# Patient Record
Sex: Male | Born: 1965 | Race: Black or African American | Hispanic: No | Marital: Single | Smoking: Current every day smoker
Health system: Southern US, Community
[De-identification: ages and names within clinical notes are randomized; demographics above are authoritative.]

## PROBLEM LIST (undated history)

## (undated) DIAGNOSIS — G8929 Other chronic pain: Secondary | ICD-10-CM

## (undated) DIAGNOSIS — W3400XA Accidental discharge from unspecified firearms or gun, initial encounter: Secondary | ICD-10-CM

## (undated) DIAGNOSIS — R569 Unspecified convulsions: Secondary | ICD-10-CM

## (undated) DIAGNOSIS — E079 Disorder of thyroid, unspecified: Secondary | ICD-10-CM

## (undated) DIAGNOSIS — D571 Sickle-cell disease without crisis: Secondary | ICD-10-CM

## (undated) DIAGNOSIS — R296 Repeated falls: Secondary | ICD-10-CM

## (undated) DIAGNOSIS — L0390 Cellulitis, unspecified: Secondary | ICD-10-CM

## (undated) DIAGNOSIS — D649 Anemia, unspecified: Secondary | ICD-10-CM

## (undated) DIAGNOSIS — I219 Acute myocardial infarction, unspecified: Secondary | ICD-10-CM

## (undated) HISTORY — PX: OTHER SURGICAL HISTORY: SHX169

## (undated) HISTORY — PX: BACK SURGERY: SHX140

## (undated) HISTORY — PX: GASTRIC BYPASS: SHX52

---

## 2015-06-06 ENCOUNTER — Encounter (HOSPITAL_COMMUNITY): Payer: Self-pay

## 2015-06-06 ENCOUNTER — Emergency Department (HOSPITAL_COMMUNITY)
Admission: EM | Admit: 2015-06-06 | Discharge: 2015-06-06 | Disposition: A | Discharge status: Home / Self Care | Payer: Medicaid Other | Attending: Emergency Medicine | Admitting: Emergency Medicine

## 2015-06-06 DIAGNOSIS — Z8639 Personal history of other endocrine, nutritional and metabolic disease: Secondary | ICD-10-CM | POA: Diagnosis not present

## 2015-06-06 DIAGNOSIS — F17200 Nicotine dependence, unspecified, uncomplicated: Secondary | ICD-10-CM | POA: Diagnosis not present

## 2015-06-06 DIAGNOSIS — Z7982 Long term (current) use of aspirin: Secondary | ICD-10-CM | POA: Diagnosis not present

## 2015-06-06 DIAGNOSIS — Z872 Personal history of diseases of the skin and subcutaneous tissue: Secondary | ICD-10-CM | POA: Insufficient documentation

## 2015-06-06 DIAGNOSIS — Z9181 History of falling: Secondary | ICD-10-CM | POA: Diagnosis not present

## 2015-06-06 DIAGNOSIS — Z4801 Encounter for change or removal of surgical wound dressing: Secondary | ICD-10-CM | POA: Diagnosis not present

## 2015-06-06 DIAGNOSIS — Z79899 Other long term (current) drug therapy: Secondary | ICD-10-CM | POA: Insufficient documentation

## 2015-06-06 DIAGNOSIS — Z862 Personal history of diseases of the blood and blood-forming organs and certain disorders involving the immune mechanism: Secondary | ICD-10-CM | POA: Diagnosis not present

## 2015-06-06 DIAGNOSIS — I252 Old myocardial infarction: Secondary | ICD-10-CM | POA: Insufficient documentation

## 2015-06-06 DIAGNOSIS — Z5189 Encounter for other specified aftercare: Secondary | ICD-10-CM

## 2015-06-06 HISTORY — DX: Acute myocardial infarction, unspecified: I21.9

## 2015-06-06 HISTORY — DX: Disorder of thyroid, unspecified: E07.9

## 2015-06-06 HISTORY — DX: Cellulitis, unspecified: L03.90

## 2015-06-06 HISTORY — DX: Sickle-cell disease without crisis: D57.1

## 2015-06-06 HISTORY — DX: Anemia, unspecified: D64.9

## 2015-06-06 HISTORY — DX: Accidental discharge from unspecified firearms or gun, initial encounter: W34.00XA

## 2015-06-06 HISTORY — DX: Repeated falls: R29.6

## 2015-06-06 LAB — CBC WITH DIFFERENTIAL/PLATELET
BASOS ABS: 0 10*3/uL (ref 0.0–0.1)
BASOS PCT: 1 %
EOS PCT: 6 %
Eosinophils Absolute: 0.2 10*3/uL (ref 0.0–0.7)
HEMATOCRIT: 35 % — AB (ref 39.0–52.0)
HEMOGLOBIN: 10.5 g/dL — AB (ref 13.0–17.0)
LYMPHS ABS: 1.3 10*3/uL (ref 0.7–4.0)
Lymphocytes Relative: 36 %
MCH: 20.9 pg — AB (ref 26.0–34.0)
MCHC: 30 g/dL (ref 30.0–36.0)
MCV: 69.6 fL — AB (ref 78.0–100.0)
MONOS PCT: 9 %
Monocytes Absolute: 0.3 10*3/uL (ref 0.1–1.0)
NEUTROS ABS: 1.9 10*3/uL (ref 1.7–7.7)
Neutrophils Relative %: 48 %
Platelets: 215 10*3/uL (ref 150–400)
RBC: 5.03 MIL/uL (ref 4.22–5.81)
RDW: 24 % — ABNORMAL HIGH (ref 11.5–15.5)
WBC MORPHOLOGY: INCREASED
WBC: 3.7 10*3/uL — ABNORMAL LOW (ref 4.0–10.5)

## 2015-06-06 LAB — BASIC METABOLIC PANEL
Anion gap: 7 (ref 5–15)
BUN: 15 mg/dL (ref 6–20)
CO2: 25 mmol/L (ref 22–32)
Calcium: 8.7 mg/dL — ABNORMAL LOW (ref 8.9–10.3)
Chloride: 105 mmol/L (ref 101–111)
Creatinine, Ser: 0.83 mg/dL (ref 0.61–1.24)
GFR calc Af Amer: >60 mL/min (ref >60)
GLUCOSE: 85 mg/dL (ref 65–99)
POTASSIUM: 4.1 mmol/L (ref 3.5–5.1)
Sodium: 137 mmol/L (ref 135–145)

## 2015-06-06 MED ORDER — BACITRACIN ZINC 500 UNIT/GM EX OINT: 1.0000 "application " | TOPICAL_OINTMENT | Freq: Two times a day (BID) | CUTANEOUS | Status: DC

## 2015-06-06 MED ORDER — SODIUM CHLORIDE 0.9 % IV BOLUS (SEPSIS): 500.0000 mL | Freq: Once | INTRAVENOUS | Status: DC

## 2015-06-06 NOTE — ED Notes (Signed)
Pt wounds dressed with guaze.

## 2015-06-06 NOTE — Discharge Instructions (Signed)
Please use a whole, Lidoderm patch, daily for 12 hours. The home health nurse will come to your home to evaluate and treat your wounds. Use the resource guide to find a primary care doctor to see for further management and treatment.   Wound Care Taking care of your wound properly can help to prevent pain and infection. It can also help your wound to heal more quickly.  HOW TO CARE FOR YOUR WOUND  Take or apply over-the-counter and prescription medicines only as told by your health care provider.  If you were prescribed antibiotic medicine, take or apply it as told by your health care provider. Do not stop using the antibiotic even if your condition improves.  Clean the wound each day or as told by your health care provider.  Wash the wound with mild soap and water.  Rinse the wound with water to remove all soap.  Pat the wound dry with a clean towel. Do not rub it.  There are many different ways to close and cover a wound. For example, a wound can be covered with stitches (sutures), skin glue, or adhesive strips. Follow instructions from your health care provider about:  How to take care of your wound.  When and how you should change your bandage (dressing).  When you should remove your dressing.  Removing whatever was used to close your wound.  Check your wound every day for signs of infection. Watch for:  Redness, swelling, or pain.  Fluid, blood, or pus.  Keep the dressing dry until your health care provider says it can be removed. Do not take baths, swim, use a hot tub, or do anything that would put your wound underwater until your health care provider approves.  Raise (elevate) the injured area above the level of your heart while you are sitting or lying down.  Do not scratch or pick at the wound.  Keep all follow-up visits as told by your health care provider. This is important. SEEK MEDICAL CARE IF:  You received a tetanus shot and you have swelling, severe  pain, redness, or bleeding at the injection site.  You have a fever.  Your pain is not controlled with medicine.  You have increased redness, swelling, or pain at the site of your wound.  You have fluid, blood, or pus coming from your wound.  You notice a bad smell coming from your wound or your dressing. SEEK IMMEDIATE MEDICAL CARE IF:  You have a red streak going away from your wound.   This information is not intended to replace advice given to you by your health care provider. Make sure you discuss any questions you have with your health care provider.   Document Released: 03/11/2008 Document Revised: 10/17/2014 Document Reviewed: 05/29/2014 Elsevier Interactive Patient Education 2016 [employer].    Emergency Department Resource Guide 1) Find a Doctor and Pay Out of Pocket Although you won't have to find out who is covered by your insurance plan, it is a good idea to ask around and get recommendations. You will then need to call the office and see if the doctor you have chosen will accept you as a new patient and what types of options they offer for patients who are self-pay. Some doctors offer discounts or will set up payment plans for their patients who do not have insurance, but you will need to ask so you aren't surprised when you get to your appointment.  2) Contact Your Local Health Department Not all health  departments have doctors that can see patients for sick visits, but many do, so it is worth a call to see if yours does. If you don't know where your local health department is, you can check in your phone book. The CDC also has a tool to help you locate your state's health department, and many state websites also have listings of all of their local health departments.  3) Find a Walk-in Clinic If your illness is not likely to be very severe or complicated, you may want to try a walk in clinic. These are popping up all over the country in pharmacies, drugstores, and  shopping centers. They're usually staffed by nurse practitioners or physician assistants that have been trained to treat common illnesses and complaints. They're usually fairly quick and inexpensive. However, if you have serious medical issues or chronic medical problems, these are probably not your best option.  No Primary Care Doctor: - Call Health Connect at  [phone] - they can help you locate a primary care doctor that  accepts your insurance, provides certain services, etc. - Physician Referral Service- [phone]  Chronic Pain Problems: Organization         Address  Phone   Notes  [facility]  [phone] Patients need to be referred by their primary care doctor.   Medication Assistance: Organization         Address  Phone   Notes  [facility] Medication [facility] [address] [place], [place] [address] [phone] --Must be a resident of [facility] -- Must have NO insurance coverage whatsoever (no Medicaid/ Medicare, etc.) -- The pt. MUST have a primary care doctor that directs their care regularly and follows them in the community   [facility]  [phone]   [employer]  [phone]    Agencies that provide inexpensive medical care: Organization         Address  Phone   Notes  [facility]  [phone]   [facility]    [phone]   [facility] [address] [place], [place] [address] [phone]   [facility] [address] [place] [place] [phone]   Planned Parenthood    [phone]   [facility]    [phone]   [facility]  [address], [place] Phone:  [phone], Fax:  [phone] Hours of Operation:  9 am - 6 pm, M-F.  Also accepts Medicaid/Medicare and self-pay.  [facility]  [address], [place] Phone: [phone],  Fax: [phone]. Hours of Operation:  8:30 am - 5:30 pm, M-F.  Also accepts Medicaid and self-pay.  [facility] [place] [address], [place] Phone: [phone]   [facility] [address] Natasha Bence [place], [place] [phone], Ext. [phone] Mondays & Thursdays: 7-9 AM.  First 15 patients are seen on a first come, first serve basis.    Medicaid-accepting [facility] Providers:  Organization         Address  Phone   Notes  [facility] [address], [place] [phone] Also accepts self-pay patients.  [facility] [address] Laurell Josephs [place], [place]  [phone]   [facility] [address], [place] [phone]   [facility] [address] [place] [address], [place] [phone]) [phone]   Renaye RakersVeita Bland [address], [place]   [phone] Only accepts [place] Access [place] patients after they have their name applied to their card.   Self-Pay (no insurance) in [facility]:  Organization         Address  Phone   Notes  Sickle Cell Patients, [facility] [address], [place] [phone]   [facility] [address], [place] [phone]) [phone]   [facility]  [address], [place] [phone]   [facility]/Dr. Osei-Bonsu  [address] [place] [address], [place] or [phone], [place] [phone]) [phone] Phone number for both [place] and [place] locations is the same.  [facility] [address], [place] [phone]) [phone]   [facility]  [address] [place] [address], [place] or [address] [phone]   [facility] [address], [place] [phone]) [phone], phone; [phone]) [phone], fax Sees patients 1st and 3rd Saturday of every month.  Must not qualify for public or private  insurance (i.e. Medicaid, Medicare, [place] Health Choice, Veterans' Benefits)  Household income should be no more than 200% of the poverty level The clinic cannot treat you if you are pregnant or think you are pregnant  Sexually transmitted diseases are not treated at the clinic.    Dental Care: Organization         Address  Phone  Notes  [facility] [address], [place] [phone] Accepts children up to age 47 who are enrolled in [place] or [place] Health Choice; pregnant women with a Medicaid card; and children who have applied for Medicaid or [place] Health Choice, but were declined, whose parents can pay a reduced fee at time of service.  [facility] [place]  [address], [place] [phone]) [phone] Accepts children up to age 47 who are enrolled in [place] or [place] Health Choice; pregnant women with a Medicaid card; and children who have applied for Medicaid or [place] Health Choice, but were declined, whose parents can pay a reduced fee at time of service.  [facility]  [address], [place] [phone]) [phone] Patients are seen by appointment only. Walk-ins are not accepted. [facility] will see patients 47 years of age and older. Monday - Tuesday (8am-5pm) Most Wednesdays (8:30-5pm) $30 per visit, cash only  [facility]  [address], [facility] [place] [phone]) [phone] Patients are seen by appointment only. Walk-ins are not accepted. [facility] will see patients 47 years of age and older. One Wednesday Evening (Monthly: Volunteer Based).  $30 per visit, cash only  [employer] [facility] [facility]  [phone]) [phone] for adults; Children under age 664, call Graduate Pediatric Dentistry at [phone]) [phone]. Children aged 714-14, please call [phone]) [phone] to request a pediatric application.  Dental services are provided in all areas of dental care  including fillings, crowns and bridges, complete and partial dentures, implants, gum treatment, root canals, and extractions. Preventive care is also provided. Treatment is provided to both adults and children. Patients are selected via a lottery and there is often a waiting list.   [facility] [address], [place]  [phone] [email or web address]   [facility] [address] [place], [place] [phone], Ext. [phone] Second and Fourth Thursday of each month, opens at 6:30 AM; Clinic ends at 9 AM.  Patients are seen on a first-come first-served basis, and a limited number are seen during each clinic.   [facility]  [address] Ether Griffins [place], [place] [phone]   Eligibility Requirements You must have lived in [place], [place], or [place] counties for at least the last three months.   You cannot be eligible for state or federal sponsored [place], including [employer], [place], or [employer].   You generally cannot be eligible for healthcare insurance through your employer.    How to apply: Eligibility screenings are held every Tuesday and Wednesday afternoon from 1:00 pm until 4:00 pm. You do not need an appointment for the interview!  [facility] [address] [place], [place] [phone]   [facility]  [phone]   [facility]  [phone]   [facility]  [phone]    Behavioral Health Resources in the Community: Intensive Outpatient Programs Organization         Address  Phone  Notes  [facility] [address] [place], [place] [phone]   [facility] Out[facility] [address] [place], [place] [phone]   [facility] [address], [place], [place]  [phone]   [facility] Mental Health [address],  [place], [place] [phone] or [phone]     Substance Abuse Resources Organization         Address  Phone  Notes  [facility]  [phone]   [facility]  [phone]   [facility] [place]  [phone]   Floydene Flock  [phone]   Residential & Outpatient Substance Abuse Program  [phone]   Psychological Services Organization         Address  Phone  Notes  [facility]  [phone]   [facility]  [phone]   [facility] Mental Health [address], [employer] [phone] or [phone]    Mobile Crisis Teams Organization         Address  Phone  Notes  [facility], Mobile Crisis Care Unit  [phone]   [facility]  [address] [place], [place] [phone]   Doristine Locks [address] [place] [phone]    Self-Help/Support Groups Organization         Address  Phone             Notes  [facility] [place] - variety of support groups  [phone]- [phone] Call for more information  Narcotics Anonymous (NA), [facility] [address], [employer] [place]  2 meetings at this location   Statistician         Address  Phone  Notes  [facility] [address],    [place]  [phone]   [facility]  [address], [place] [address], [place], [place] [phone]   [facility] [address] [place], [place] [phone] Admissions: 8am-3pm M-F  [facility] [address],    [place], [place] [phone]   [facility] [address] Starling Manns [place], [place] [phone]   [facility] [address]  [place], [place] [phone]   [facility] - Intensive Outpatient [phone] Laurell Josephs  [address], [place], [place] [phone]   [facility] ([facility] [address] [place]  [place], [place] [phone] or [phone]   [facility]  Services (RTS) [address] [place], [place] [phone] Accepts Medicaid  [facility] [address],  [place] [phone] Substance Abuse/Addiction Treatment   [facility] Organization         Address  Phone  Notes  [facility]  [phone]   Angie Fava, PhD [address] Ervin Knack [place], [place]   [phone] or [phone]   [facility]   [address] [place], [place] [phone]   [facility] [address] [place], [place] [phone] Insurance/Medicaid/sponsorship through [facility] and Families [address]                                    [place], [place] [phone] Therapy/tele-psych/case  [facility] [address], [place] [phone]    Dr. Lolly Mustache  [phone]   [facility] [place]  [employer] [facility] Dept. 1) [address] [place] 2) [address], [place] 3)  [address], [place] [phone]  [phone]   [facility] Child Abuse Hotline [phone] or [phone] (After Hours)
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
## 2015-06-07 ENCOUNTER — Encounter (HOSPITAL_COMMUNITY): Payer: Self-pay | Admitting: *Deleted

## 2015-06-07 ENCOUNTER — Emergency Department (HOSPITAL_COMMUNITY)
Admission: EM | Admit: 2015-06-07 | Discharge: 2015-06-08 | Disposition: A | Discharge status: Home / Self Care | Payer: Medicaid Other | Attending: Emergency Medicine | Admitting: Emergency Medicine

## 2015-06-07 ENCOUNTER — Emergency Department (HOSPITAL_COMMUNITY): Payer: Medicaid Other

## 2015-06-07 DIAGNOSIS — R079 Chest pain, unspecified: Secondary | ICD-10-CM

## 2015-06-07 DIAGNOSIS — G8929 Other chronic pain: Secondary | ICD-10-CM | POA: Diagnosis not present

## 2015-06-07 DIAGNOSIS — Z87828 Personal history of other (healed) physical injury and trauma: Secondary | ICD-10-CM | POA: Diagnosis not present

## 2015-06-07 DIAGNOSIS — F17200 Nicotine dependence, unspecified, uncomplicated: Secondary | ICD-10-CM | POA: Insufficient documentation

## 2015-06-07 DIAGNOSIS — R300 Dysuria: Secondary | ICD-10-CM | POA: Diagnosis not present

## 2015-06-07 DIAGNOSIS — L03113 Cellulitis of right upper limb: Secondary | ICD-10-CM | POA: Insufficient documentation

## 2015-06-07 DIAGNOSIS — D649 Anemia, unspecified: Secondary | ICD-10-CM | POA: Insufficient documentation

## 2015-06-07 DIAGNOSIS — R569 Unspecified convulsions: Secondary | ICD-10-CM | POA: Diagnosis not present

## 2015-06-07 DIAGNOSIS — Z7982 Long term (current) use of aspirin: Secondary | ICD-10-CM | POA: Diagnosis not present

## 2015-06-07 DIAGNOSIS — R0789 Other chest pain: Secondary | ICD-10-CM | POA: Insufficient documentation

## 2015-06-07 DIAGNOSIS — Z79899 Other long term (current) drug therapy: Secondary | ICD-10-CM | POA: Insufficient documentation

## 2015-06-07 DIAGNOSIS — I252 Old myocardial infarction: Secondary | ICD-10-CM | POA: Insufficient documentation

## 2015-06-07 DIAGNOSIS — E079 Disorder of thyroid, unspecified: Secondary | ICD-10-CM | POA: Diagnosis not present

## 2015-06-07 MED ORDER — SODIUM CHLORIDE 0.9 % IV BOLUS (SEPSIS): 1000.0000 mL | Freq: Once | INTRAVENOUS | Status: DC

## 2015-06-07 MED ORDER — LEVETIRACETAM 500 MG PO TABS
500.0000 mg | ORAL_TABLET | Freq: Once | ORAL | Status: AC
  Administered 2015-06-08: 500 mg via ORAL
  Filled 2015-06-07: qty 1

## 2015-06-07 MED ORDER — CARBAMAZEPINE 200 MG PO TABS
200.0000 mg | ORAL_TABLET | Freq: Once | ORAL | Status: AC
  Administered 2015-06-08: 200 mg via ORAL
  Filled 2015-06-07: qty 1

## 2015-06-07 MED ORDER — HYDROMORPHONE HCL 1 MG/ML IJ SOLN: 1.0000 mg | Freq: Once | INTRAMUSCULAR | Status: DC
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
## 2015-06-08 ENCOUNTER — Emergency Department (HOSPITAL_COMMUNITY)
Admission: EM | Admit: 2015-06-08 | Discharge: 2015-06-08 | Discharge status: Absent without leave | Payer: Medicaid Other

## 2015-06-08 LAB — CBC WITH DIFFERENTIAL/PLATELET
BASOS ABS: 0 10*3/uL (ref 0.0–0.1)
Basophils Relative: 1 %
EOS ABS: 0.2 10*3/uL (ref 0.0–0.7)
EOS PCT: 6 %
HCT: 32.2 % — ABNORMAL LOW (ref 39.0–52.0)
Hemoglobin: 9.7 g/dL — ABNORMAL LOW (ref 13.0–17.0)
Lymphocytes Relative: 44 %
Lymphs Abs: 1.6 10*3/uL (ref 0.7–4.0)
MCH: 20.8 pg — ABNORMAL LOW (ref 26.0–34.0)
MCHC: 30.1 g/dL (ref 30.0–36.0)
MCV: 69.1 fL — ABNORMAL LOW (ref 78.0–100.0)
MONOS PCT: 7 %
Monocytes Absolute: 0.3 10*3/uL (ref 0.1–1.0)
NEUTROS ABS: 1.5 10*3/uL — AB (ref 1.7–7.7)
Neutrophils Relative %: 42 %
PLATELETS: 182 10*3/uL (ref 150–400)
RBC: 4.66 MIL/uL (ref 4.22–5.81)
RDW: 23.3 % — AB (ref 11.5–15.5)
WBC: 3.6 10*3/uL — ABNORMAL LOW (ref 4.0–10.5)

## 2015-06-08 LAB — COMPREHENSIVE METABOLIC PANEL
ALBUMIN: 3.3 g/dL — AB (ref 3.5–5.0)
ALT: 39 U/L (ref 17–63)
AST: 34 U/L (ref 15–41)
Alkaline Phosphatase: 100 U/L (ref 38–126)
Anion gap: 6 (ref 5–15)
BUN: 15 mg/dL (ref 6–20)
CHLORIDE: 105 mmol/L (ref 101–111)
CO2: 26 mmol/L (ref 22–32)
CREATININE: 0.89 mg/dL (ref 0.61–1.24)
Calcium: 8.7 mg/dL — ABNORMAL LOW (ref 8.9–10.3)
GFR calc Af Amer: >60 mL/min (ref >60)
GFR calc non Af Amer: >60 mL/min (ref >60)
GLUCOSE: 84 mg/dL (ref 65–99)
POTASSIUM: 3.8 mmol/L (ref 3.5–5.1)
Sodium: 137 mmol/L (ref 135–145)
Total Bilirubin: 0.3 mg/dL (ref 0.3–1.2)
Total Protein: 6.9 g/dL (ref 6.5–8.1)

## 2015-06-08 LAB — URINALYSIS, ROUTINE W REFLEX MICROSCOPIC
BILIRUBIN URINE: NEGATIVE
Glucose, UA: NEGATIVE mg/dL
Hgb urine dipstick: NEGATIVE
KETONES UR: NEGATIVE mg/dL
LEUKOCYTES UA: NEGATIVE
NITRITE: NEGATIVE
PROTEIN: NEGATIVE mg/dL
pH: 5.5 (ref 5.0–8.0)

## 2015-06-08 LAB — SAMPLE TO BLOOD BANK

## 2015-06-08 LAB — RETICULOCYTES
RBC.: 4.66 MIL/uL (ref 4.22–5.81)
RETIC CT PCT: 1.6 % (ref 0.4–3.1)
Retic Count, Absolute: 74.6 10*3/uL (ref 19.0–186.0)

## 2015-06-08 LAB — TROPONIN I

## 2015-06-08 MED ORDER — OXYCODONE-ACETAMINOPHEN 5-325 MG PO TABS
2.0000 | ORAL_TABLET | Freq: Once | ORAL | Status: AC
  Administered 2015-06-08: 2 via ORAL
  Filled 2015-06-08: qty 2

## 2015-06-08 MED ORDER — SULFAMETHOXAZOLE-TRIMETHOPRIM 800-160 MG PO TABS
1.0000 | ORAL_TABLET | Freq: Once | ORAL | Status: AC
  Administered 2015-06-08: 1 via ORAL
  Filled 2015-06-08: qty 1

## 2015-06-08 MED ORDER — SULFAMETHOXAZOLE-TRIMETHOPRIM 800-160 MG PO TABS
1.0000 | ORAL_TABLET | Freq: Two times a day (BID) | ORAL | Status: AC
Start: 2015-06-08 — End: 2015-06-15

## 2015-06-08 MED ORDER — HYDROMORPHONE HCL 1 MG/ML IJ SOLN
1.0000 mg | Freq: Once | INTRAMUSCULAR | Status: AC
  Administered 2015-06-08: 1 mg via INTRAMUSCULAR
  Filled 2015-06-08: qty 1

## 2015-06-08 NOTE — ED Notes (Signed)
Patient's home facility manager called about home health and that she has not heard from them today. Orders placed in chart by Dr Effie ShyWentz 12/21. Informed administrator that once the order is placed that Case Management facilitates coordination that the ED does not. Transferred patient to case management. Several minutes later, administrator at facility calls back. Unable to get a clear answer from her as to what she needs. She states she needs the order and doctor's name to which she has the information and that this is what case management needed. RN again checked orders and Dr Effie ShyWentz placed home health order and face to face documentation needed and told her case manangement should be able to see these orders in his chart. Administrator requesting she again be transferred to Case Management to which she was.

## 2015-06-08 NOTE — ED Notes (Signed)
[facility] with [name]. [facility]ly states "we dont have a ride for him until six in the morning" agreed to allow patient to stay in ED until they can provide transportation

## 2015-06-08 NOTE — Progress Notes (Signed)
CM called by [facility]ever[facility] about needing home health services arranged. Pt was in ED on 06/06/15 [facility]. CM dept was never notified of home health needs. Pt has no PCP. Oneal GroutBeverly Rucker was notified that home health services cannot be arranged until pt has PCP. [facility] and find pt PCP and then home health services can be arranged.

## 2015-06-08 NOTE — Discharge Instructions (Signed)
Your labs, EKG, chest x-ray and urine are reassuring today. We're putting you on antibiotics for a small amount of redness seen on the right hand. Please continue taking your Tegretol and Keppra as prescribed for your seizures. We are also discharging you with resources for primary care provider and establish care with a pain management clinic.   Cellulitis Cellulitis is an infection of the skin and the tissue beneath it. The infected area is usually red and tender. Cellulitis occurs most often in the arms and lower legs.  CAUSES  Cellulitis is caused by bacteria that enter the skin through cracks or cuts in the skin. The most common types of bacteria that cause cellulitis are staphylococci and streptococci. SIGNS AND SYMPTOMS   Redness and warmth.  Swelling.  Tenderness or pain.  Fever. DIAGNOSIS  Your health care provider can usually determine what is wrong based on a physical exam. Blood tests may also be done. TREATMENT  Treatment usually involves taking an antibiotic medicine. HOME CARE INSTRUCTIONS   Take your antibiotic medicine as directed by your health care provider. Finish the antibiotic even if you start to feel better.  Keep the infected arm or leg elevated to reduce swelling.  Apply a warm cloth to the affected area up to 4 times per day to relieve pain.  Take medicines only as directed by your health care provider.  Keep all follow-up visits as directed by your health care provider. SEEK MEDICAL CARE IF:   You notice red streaks coming from the infected area.  Your red area gets larger or turns dark in color.  Your bone or joint underneath the infected area becomes painful after the skin has healed.  Your infection returns in the same area or another area.  You notice a swollen bump in the infected area.  You develop new symptoms.  You have a fever. SEEK IMMEDIATE MEDICAL CARE IF:   You feel very sleepy.  You develop vomiting or diarrhea.  You have a  general ill feeling (malaise) with muscle aches and pains.   This information is not intended to replace advice given to you by your health care provider. Make sure you discuss any questions you have with your health care provider.   Document Released: 03/12/2005 Document Revised: 02/21/2015 Document Reviewed: 08/18/2011 Elsevier Interactive Patient Education 2016 Elsevier Inc.  Chronic Pain Chronic pain can be defined as pain that is off and on and lasts for 3-6 months or longer. Many things cause chronic pain, which can make it difficult to make a diagnosis. There are many treatment options available for chronic pain. However, finding a treatment that works well for you may require trying various approaches until the right one is found. Many people benefit from a combination of two or more types of treatment to control their pain. SYMPTOMS  Chronic pain can occur anywhere in the body and can range from mild to very severe. Some types of chronic pain include:  Headache.  Low back pain.  Cancer pain.  Arthritis pain.  Neurogenic pain. This is pain resulting from damage to nerves. People with chronic pain may also have other symptoms such as:  Depression.  Anger.  Insomnia.  Anxiety. DIAGNOSIS  Your health care provider will help diagnose your condition over time. In many cases, the initial focus will be on excluding possible conditions that could be causing the pain. Depending on your symptoms, your health care provider may order tests to diagnose your condition. Some of these tests may  include:   Blood tests.   CT scan.   MRI.   X-rays.   Ultrasounds.   Nerve conduction studies.  You may need to see a specialist.  TREATMENT  Finding treatment that works well may take time. You may be referred to a pain specialist. He or she may prescribe medicine or therapies, such as:   Mindful meditation or yoga.  Shots (injections) of numbing or pain-relieving medicines  into the spine or area of pain.  Local electrical stimulation.  Acupuncture.   Massage therapy.   Aroma, color, light, or sound therapy.   Biofeedback.   Working with a physical therapist to keep from getting stiff.   Regular, gentle exercise.   Cognitive or behavioral therapy.   Group support.  Sometimes, surgery may be recommended.  HOME CARE INSTRUCTIONS   Take all medicines as directed by your health care provider.   Lessen stress in your life by relaxing and doing things such as listening to calming music.   Exercise or be active as directed by your health care provider.   Eat a healthy diet and include things such as vegetables, fruits, fish, and lean meats in your diet.   Keep all follow-up appointments with your health care provider.   Attend a support group with others suffering from chronic pain. SEEK MEDICAL CARE IF:   Your pain gets worse.   You develop a new pain that was not there before.   You cannot tolerate medicines given to you by your health care provider.   You have new symptoms since your last visit with your health care provider.  SEEK IMMEDIATE MEDICAL CARE IF:   You feel weak.   You have decreased sensation or numbness.   You lose control of bowel or bladder function.   Your pain suddenly gets much worse.   You develop shaking.  You develop chills.  You develop confusion.  You develop chest pain.  You develop shortness of breath.  MAKE SURE YOU:  Understand these instructions.  Will watch your condition.  Will get help right away if you are not doing well or get worse.   This information is not intended to replace advice given to you by your health care provider. Make sure you discuss any questions you have with your health care provider.   Document Released: 02/22/2002 Document Revised: 02/02/2013 Document Reviewed: 11/26/2012 Elsevier Interactive Patient Education 2016 Elsevier  Inc.  Nonspecific Chest Pain  Chest pain can be caused by many different conditions. There is always a chance that your pain could be related to something serious, such as a heart attack or a blood clot in your lungs. Chest pain can also be caused by conditions that are not life-threatening. If you have chest pain, it is very important to follow up with your health care provider. CAUSES  Chest pain can be caused by:  Heartburn.  Pneumonia or bronchitis.  Anxiety or stress.  Inflammation around your heart (pericarditis) or lung (pleuritis or pleurisy).  A blood clot in your lung.  A collapsed lung (pneumothorax). It can develop suddenly on its own (spontaneous pneumothorax) or from trauma to the chest.  Shingles infection (varicella-zoster virus).  Heart attack.  Damage to the bones, muscles, and cartilage that make up your chest wall. This can include:  Bruised bones due to injury.  Strained muscles or cartilage due to frequent or repeated coughing or overwork.  Fracture to one or more ribs.  Sore cartilage due to inflammation (costochondritis). RISK  FACTORS  Risk factors for chest pain may include:  Activities that increase your risk for trauma or injury to your chest.  Respiratory infections or conditions that cause frequent coughing.  Medical conditions or overeating that can cause heartburn.  Heart disease or family history of heart disease.  Conditions or health behaviors that increase your risk of developing a blood clot.  Having had chicken pox (varicella zoster). SIGNS AND SYMPTOMS Chest pain can feel like:  Burning or tingling on the surface of your chest or deep in your chest.  Crushing, pressure, aching, or squeezing pain.  Dull or sharp pain that is worse when you move, cough, or take a deep breath.  Pain that is also felt in your back, neck, shoulder, or arm, or pain that spreads to any of these areas. Your chest pain may come and go, or it may  stay constant. DIAGNOSIS Lab tests or other studies may be needed to find the cause of your pain. Your health care provider may have you take a test called an ambulatory ECG (electrocardiogram). An ECG records your heartbeat patterns at the time the test is performed. You may also have other tests, such as:  Transthoracic echocardiogram (TTE). During echocardiography, sound waves are used to create a picture of all of the heart structures and to look at how blood flows through your heart.  Transesophageal echocardiogram (TEE).This is a more advanced imaging test that obtains images from inside your body. It allows your health care provider to see your heart in finer detail.  Cardiac monitoring. This allows your health care provider to monitor your heart rate and rhythm in real time.  Holter monitor. This is a portable device that records your heartbeat and can help to diagnose abnormal heartbeats. It allows your health care provider to track your heart activity for several days, if needed.  Stress tests. These can be done through exercise or by taking medicine that makes your heart beat more quickly.  Blood tests.  Imaging tests. TREATMENT  Your treatment depends on what is causing your chest pain. Treatment may include:  Medicines. These may include:  Acid blockers for heartburn.  Anti-inflammatory medicine.  Pain medicine for inflammatory conditions.  Antibiotic medicine, if an infection is present.  Medicines to dissolve blood clots.  Medicines to treat coronary artery disease.  Supportive care for conditions that do not require medicines. This may include:  Resting.  Applying heat or cold packs to injured areas.  Limiting activities until pain decreases. HOME CARE INSTRUCTIONS  If you were prescribed an antibiotic medicine, finish it all even if you start to feel better.  Avoid any activities that bring on chest pain.  Do not use any tobacco products, including  cigarettes, chewing tobacco, or electronic cigarettes. If you need help quitting, ask your health care provider.  Do not drink alcohol.  Take medicines only as directed by your health care provider.  Keep all follow-up visits as directed by your health care provider. This is important. This includes any further testing if your chest pain does not go away.  If heartburn is the cause for your chest pain, you may be told to keep your head raised (elevated) while sleeping. This reduces the chance that acid will go from your stomach into your esophagus.  Make lifestyle changes as directed by your health care provider. These may include:  Getting regular exercise. Ask your health care provider to suggest some activities that are safe for you.  Eating a heart-healthy  diet. A registered dietitian can help you to learn healthy eating options.  Maintaining a healthy weight.  Managing diabetes, if necessary.  Reducing stress. SEEK MEDICAL CARE IF:  Your chest pain does not go away after treatment.  You have a rash with blisters on your chest.  You have a fever. SEEK IMMEDIATE MEDICAL CARE IF:   Your chest pain is worse.  You have an increasing cough, or you cough up blood.  You have severe abdominal pain.  You have severe weakness.  You faint.  You have chills.  You have sudden, unexplained chest discomfort.  You have sudden, unexplained discomfort in your arms, back, neck, or jaw.  You have shortness of breath at any time.  You suddenly start to sweat, or your skin gets clammy.  You feel nauseous or you vomit.  You suddenly feel light-headed or dizzy.  Your heart begins to beat quickly, or it feels like it is skipping beats. These symptoms may represent a serious problem that is an emergency. Do not wait to see if the symptoms will go away. Get medical help right away. Call your local emergency services (911 in the [place] Do not drive yourself to the hospital.   This  information is not intended to replace advice given to you by your health care provider. Make sure you discuss any questions you have with your health care provider.   Document Released: 03/12/2005 Document Revised: 06/23/2014 Document Reviewed: 01/06/2014 Elsevier Interactive Patient Education [employer]  Seizure, Adult A seizure is abnormal electrical activity in the brain. Seizures usually last from 30 seconds to 2 minutes. There are various types of seizures. Before a seizure, you may have a warning sensation (aura) that a seizure is about to occur. An aura may include the following symptoms:   Fear or anxiety.  Nausea.  Feeling like the room is spinning (vertigo).  Vision changes, such as seeing flashing lights or spots. Common symptoms during a seizure include:  A change in attention or behavior (altered mental status).  Convulsions with rhythmic jerking movements.  Drooling.  Rapid eye movements.  Grunting.  Loss of bladder and bowel control.  Bitter taste in the mouth.  Tongue biting. After a seizure, you may feel confused and sleepy. You may also have an injury resulting from convulsions during the seizure. HOME CARE INSTRUCTIONS   If you are given medicines, take them exactly as prescribed by your health care provider.  Keep all follow-up appointments as directed by your health care provider.  Do not swim or drive or engage in risky activity during which a seizure could cause further injury to you or others until your health care provider says it is OK.  Get adequate rest.  Teach friends and family what to do if you have a seizure. They should:  Lay you on the ground to prevent a fall.  Put a cushion under your head.  Loosen any tight clothing around your neck.  Turn you on your side. If vomiting occurs, this helps keep your airway clear.  Stay with you until you recover.  Know whether or not you need emergency care. SEEK IMMEDIATE MEDICAL  CARE IF:  The seizure lasts longer than 5 minutes.  The seizure is severe or you do not wake up immediately after the seizure.  You have an altered mental status after the seizure.  You are having more frequent or worsening seizures. Someone should drive you to the emergency department or call local emergency services (  911 in [place] MAKE SURE YOU:  Understand these instructions.  Will watch your condition.  Will get help right away if you are not doing well or get worse.   This information is not intended to replace advice given to you by your health care provider. Make sure you discuss any questions you have with your health care provider.   Document Released: 05/30/2000 Document Revised: 06/23/2014 Document Reviewed: 01/12/2013 Elsevier Interactive Patient Education 2016 [employer].    Emergency Department Resource Guide 1) Find a Doctor and Pay Out of Pocket Although you won't have to find out who is covered by your insurance plan, it is a good idea to ask around and get recommendations. You will then need to call the office and see if the doctor you have chosen will accept you as a new patient and what types of options they offer for patients who are self-pay. Some doctors offer discounts or will set up payment plans for their patients who do not have insurance, but you will need to ask so you aren't surprised when you get to your appointment.  2) Contact Your Local Health Department Not all health departments have doctors that can see patients for sick visits, but many do, so it is worth a call to see if yours does. If you don't know where your local health department is, you can check in your phone book. The CDC also has a tool to help you locate your state's health department, and many state websites also have listings of all of their local health departments.  3) Find a Walk-in Clinic If your illness is not likely to be very severe or complicated, you may want to try a  walk in clinic. These are popping up all over the country in pharmacies, drugstores, and shopping centers. They're usually staffed by nurse practitioners or physician assistants that have been trained to treat common illnesses and complaints. They're usually fairly quick and inexpensive. However, if you have serious medical issues or chronic medical problems, these are probably not your best option.  No Primary Care Doctor: - Call Health Connect at  [phone] - they can help you locate a primary care doctor that  accepts your insurance, provides certain services, etc. - Physician Referral Service- [phone]  Chronic Pain Problems: Organization         Address  Phone   Notes  [facility]  [phone] Patients need to be referred by their primary care doctor.   Medication Assistance: Organization         Address  Phone   Notes  [facility] Medication [facility] [address] [place] [place], [place] [address] [phone] --Must be a resident of [facility] -- Must have NO insurance coverage whatsoever (no Medicaid/ Medicare, etc.) -- The pt. MUST have a primary care doctor that directs their care regularly and follows them in the community   [facility]  [phone]   [employer]  [phone]    Agencies that provide inexpensive medical care: Organization         Address  Phone   Notes  [facility]  [phone]   [facility]    [phone]   [facility] [address] [place], [place] [address] [phone]   [facility] [address], [place] [phone]   [facility]    [phone]   [facility]    [phone]   [facility]  [address], [place] Phone:  [phone], Fax:  [phone] Hours of Operation:  9 am - 6 pm, M-F.  Also accepts Medicaid/Medicare and self-pay.  [facility]  [address], [place] Phone: [phone], Fax: [phone]. Hours of Operation:  8:30 am - 5:30 pm, M-F.  Also accepts Medicaid and self-pay.  [facility] [place] [address], [place] Phone: [phone]   [facility] [address] [place], [place] [phone], Ext. [phone] Mondays & Thursdays: 7-9 AM.  First 15 patients are seen on a first come, first serve basis.    Medicaid-accepting [facility] Providers:  Organization         Address  Phone   Notes  [facility] [address], [place] [phone] Also accepts self-pay patients.  [facility] [address] Laurell Josephs [place], [place]  [phone]   [facility] [address], [place] [phone]   [facility] [address], [place] [phone]   Renaye Rakers [address], [place]   [phone] Only accepts [place] patients after they have their name applied to their card.   Self-Pay (no insurance) in [facility]:  Organization         Address  Phone   Notes  Sickle Cell Patients, [facility] [address] [place], [place] [phone]   [facility] [address], [place] [phone]   [facility] [place]  [address], [place] [phone]   [facility]/Dr. Osei-Bonsu  [address] [place] or [address], [place] [phone] Phone number for both [place] and [place] locations is the same.  [facility] [address] [place] [phone]   [facility] [address] [place] or [address] [phone]   [facility] [address], [place] [phone], phone; [phone], fax Sees  patients 1st and 3rd Saturday of every month.  Must not qualify for public or private insurance (i.e. Medicaid, Medicare, [place] Health Choice, Veterans' Benefits)  Household income should be no more than 200% of the poverty level The clinic cannot treat you if you are pregnant or think you are pregnant  Sexually transmitted diseases are not treated at the clinic.    Dental Care: Organization         Address  Phone  Notes  [facility] Department of [facility] [address] [place], [place] [phone] Accepts children up to age 52 who are enrolled in [place] or [place] Health Choice; pregnant women with a Medicaid card; and children who have applied for Medicaid or [place] Health Choice, but were declined, whose parents can pay a reduced fee at time of service.  [facility] of [facility]  [address], [place] [phone] Accepts children up to age 22 who are enrolled in [place] or [place] Health Choice; pregnant women with a Medicaid card; and children who have applied for Medicaid or  Health Choice, but were declined, whose parents can pay a reduced fee at time of service.  [facility]  [address] [place], [place] [phone] Patients are seen by appointment only. Walk-ins are not accepted. [facility] will see patients 45 years of age and older. Monday - Tuesday (8am-5pm) Most Wednesdays (8:30-5pm) $30 per visit, cash only  [facility] Access [facility]  [address], [facility] [phone] Patients are seen by appointment only. Walk-ins are not accepted. [facility] will see patients 64 years of age and older. One Wednesday Evening (Monthly: Volunteer Based).  $30 per visit, cash only  [employer]  [phone] for adults; Children under age 35, call Graduate Pediatric Dentistry at [phone]. Children aged 52-14, please call [phone] to request a  pediatric application.  Dental services are provided in all areas of dental care including fillings, crowns and bridges, complete and partial dentures, implants, gum treatment, root canals, and extractions. Preventive care is also provided. Treatment is provided to both adults and children. Patients are selected via a lottery and there is often a waiting list.   [facility] [address] [place]  [phone] [email or web address]   [facility] [address] [place], [place] [phone], Ext. [phone] Second and Fourth Thursday of each month, opens at 6:30 AM; Clinic ends at 9 AM.  Patients are seen on a first-come first-served basis, and a limited number are seen during each clinic.   [facility]  [address] Ether Griffins [place], [place] [phone]   Eligibility Requirements You must have lived in [place], [place], or [place] counties for at least the last three months.   You cannot be eligible for state or federal sponsored [place], including [employer], [place], or [employer].   You generally cannot be eligible for healthcare insurance through your employer.    How to apply: Eligibility screenings are held every Tuesday and Wednesday afternoon from 1:00 pm until 4:00 pm. You do not need an appointment for the interview!  [facility] [address] [place], [place] [phone]   [facility] Health Department  [phone]   [facility] Health Department  [phone]   [facility] Department  [phone]    Behavioral Health Resources in the Community: Intensive Outpatient Programs Organization         Address  Phone  Notes  [facility] [address], [place], [place] [phone]   [facility] [address] [place], [place] [phone]   [facility] [address] [place], [place]  [phone]   [facility]  Mental Health [address],  [place], [place] [phone] or [phone]   Substance Abuse Resources Organization         Address  Phone  Notes  [facility]  [phone]   [facility]  [phone]   [facility] [place]  [phone]   Floydene Flock  [phone]   Residential & Outpatient Substance Abuse Program  [phone]   Psychological Services Organization         Address  Phone  Notes  [facility] [facility]  [phone]   [facility]  [phone]   [facility] [address] [place] [phone] or [phone]    Mobile Crisis Teams Organization         Address  Phone  Notes  [facility], Mobile Crisis Care Unit  [phone]   [facility]  [address] [place],  [place] [phone]   [facility] [address] [place] [phone]    Self-Help/Support Groups Organization         Address  Phone             Notes  [facility] [place] - variety of support groups  336- [phone] Call for more information  Narcotics Anonymous (NA), [facility] [address], [place]  2 meetings at this location   [facility] Organization         Address  Phone  Notes  [facility] [address],    [place]  [phone]   [facility]  [address], [place] [phone], [place], [place] [phone]   [facility] [address], [place] [phone] Admissions: 8am-3pm M-F  [facility] [address]    [place], [place] [phone]   [facility] [address], [place], [place] [phone]   [facility] [address]  [place], [place] [phone]   [facility] [address], [place], [place] [phone]   [facility] [address]    [place], [place] [phone] or [phone]   [facility] ([facility]) [address] [place], [place] [phone] Accepts Medicaid  [facility] [address]  [place] [phone] Substance Abuse/Addiction Treatment   [facility] Resources Organization         Address  Phone  Notes  [facility]  [phone]) [phone]   Angie FavaJulie Brannon, PhD [address], [address] [place], [place]   [phone](336) [phone] or [phone]) [phone]   [facility]    [address] [place], [place] [phone]) [phone]   [facility] [address], [place], [place] [phone]) [phone] Insurance/Medicaid/sponsorship through [facility]  [facility] [address]                                    [place], [place] [phone]) [phone] Therapy/tele-psych/case  [facility] [address]   [place], [place] [phone]) [phone]    Dr. Lolly MustacheArfeen  [phone]   [facility]  [employer] [facility] 1) [address], [place] 2) [address], [place] 3)  [address], [place] [phone]) [phone]) [phone]  [phone]   [facility] Child Abuse Hotline [phone]) [phone] or [phone](336) [phone] (After Hours)

## 2015-06-09 LAB — RPR: RPR: NONREACTIVE

## 2015-06-09 LAB — HIV ANTIBODY (ROUTINE TESTING W REFLEX): HIV SCREEN 4TH GENERATION: NONREACTIVE

## 2015-06-12 LAB — GC/CHLAMYDIA PROBE AMP (~~LOC~~) NOT AT ARMC
Chlamydia: NEGATIVE
Neisseria Gonorrhea: NEGATIVE

## 2015-06-22 ENCOUNTER — Emergency Department (HOSPITAL_COMMUNITY)
Admission: EM | Admit: 2015-06-22 | Discharge: 2015-06-22 | Disposition: A | Discharge status: Home / Self Care | Payer: Medicaid Other | Attending: Emergency Medicine | Admitting: Emergency Medicine

## 2015-06-22 ENCOUNTER — Encounter (HOSPITAL_COMMUNITY): Payer: Self-pay | Admitting: Emergency Medicine

## 2015-06-22 DIAGNOSIS — G8929 Other chronic pain: Secondary | ICD-10-CM

## 2015-06-22 DIAGNOSIS — Z7982 Long term (current) use of aspirin: Secondary | ICD-10-CM | POA: Insufficient documentation

## 2015-06-22 DIAGNOSIS — D649 Anemia, unspecified: Secondary | ICD-10-CM | POA: Insufficient documentation

## 2015-06-22 DIAGNOSIS — Z872 Personal history of diseases of the skin and subcutaneous tissue: Secondary | ICD-10-CM | POA: Diagnosis not present

## 2015-06-22 DIAGNOSIS — E079 Disorder of thyroid, unspecified: Secondary | ICD-10-CM | POA: Diagnosis not present

## 2015-06-22 DIAGNOSIS — R109 Unspecified abdominal pain: Secondary | ICD-10-CM | POA: Diagnosis present

## 2015-06-22 DIAGNOSIS — Z79899 Other long term (current) drug therapy: Secondary | ICD-10-CM | POA: Insufficient documentation

## 2015-06-22 DIAGNOSIS — Z87828 Personal history of other (healed) physical injury and trauma: Secondary | ICD-10-CM | POA: Insufficient documentation

## 2015-06-22 DIAGNOSIS — Z792 Long term (current) use of antibiotics: Secondary | ICD-10-CM | POA: Diagnosis not present

## 2015-06-22 DIAGNOSIS — I252 Old myocardial infarction: Secondary | ICD-10-CM | POA: Diagnosis not present

## 2015-06-22 DIAGNOSIS — F17200 Nicotine dependence, unspecified, uncomplicated: Secondary | ICD-10-CM | POA: Diagnosis not present

## 2015-06-22 DIAGNOSIS — Z760 Encounter for issue of repeat prescription: Secondary | ICD-10-CM | POA: Insufficient documentation

## 2015-06-22 MED ORDER — FAMOTIDINE 20 MG PO TABS: 20.0000 mg | ORAL_TABLET | Freq: Two times a day (BID) | ORAL | Status: AC

## 2015-06-22 MED ORDER — LEVOTHYROXINE SODIUM 50 MCG PO TABS: 50.0000 ug | ORAL_TABLET | Freq: Every day | ORAL | Status: AC

## 2015-06-22 MED ORDER — OXYCODONE-ACETAMINOPHEN 5-325 MG PO TABS
2.0000 | ORAL_TABLET | Freq: Once | ORAL | Status: AC
  Administered 2015-06-22: 2 via ORAL
  Filled 2015-06-22: qty 2

## 2015-06-22 MED ORDER — FOLIC ACID 1 MG PO TABS: 1.0000 mg | ORAL_TABLET | Freq: Every day | ORAL | Status: DC
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
## 2015-06-22 NOTE — Discharge Instructions (Signed)
Chronic Pain Discharge Instructions  °Emergency care providers appreciate that many patients coming to us are in severe pain and we wish to address their pain in the safest, most responsible manner.  It is important to recognize however, that the proper treatment of chronic pain differs from that of the pain of injuries and acute illnesses.  Our goal is to provide quality, safe, personalized care and we thank you for giving us the opportunity to serve you. °The use of narcotics and related agents for chronic pain syndromes may lead to additional physical and psychological problems.  Nearly as many people die from prescription narcotics each year as die from car crashes.  Additionally, this risk is increased if such prescriptions are obtained from a variety of sources.  Therefore, only your primary care physician or a pain management specialist is able to safely treat such syndromes with narcotic medications long-term.   ° °Documentation revealing such prescriptions have been sought from multiple sources may prohibit us from providing a refill or different narcotic medication.  Your name may be checked first through the [place] Controlled Substances Reporting System.  This database is a record of controlled substance medication prescriptions that the patient has received.  This has been established by [facility] in an effort to eliminate the dangerous, and often life threatening, practice of obtaining multiple prescriptions from different medical providers.  ° °If you have a chronic pain syndrome (i.e. chronic headaches, recurrent back or neck pain, dental pain, abdominal or pelvis pain without a specific diagnosis, or neuropathic pain such as fibromyalgia) or recurrent visits for the same condition without an acute diagnosis, you may be treated with non-narcotics and other non-addictive medicines.  Allergic reactions or negative side effects that may be reported by a patient to such medications will not  typically lead to the use of a narcotic analgesic or other controlled substance as an alternative. °  °Patients managing chronic pain with a personal physician should have provisions in place for breakthrough pain.  If you are in crisis, you should call your physician.  If your physician directs you to the emergency department, please have the doctor call and speak to our attending physician concerning your care. °  °When patients come to the Emergency Department (ED) with acute medical conditions in which the Emergency Department physician feels appropriate to prescribe narcotic or sedating pain medication, the physician will prescribe these in very limited quantities.  The amount of these medications will last only until you can see your primary care physician in his/her office.  Any patient who returns to the ED seeking refills should expect only non-narcotic pain medications.  ° °In the event of an acute medical condition exists and the emergency physician feels it is necessary that the patient be given a narcotic or sedating medication -  a responsible adult driver should be present in the room prior to the medication being given by the nurse. °  °Prescriptions for narcotic or sedating medications that have been lost, stolen or expired will not be refilled in the Emergency Department.   ° °Patients who have chronic pain may receive non-narcotic prescriptions until seen by their primary care physician.  It is every patient’s personal responsibility to maintain active prescriptions with his or her primary care physician or specialist. °
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
## 2015-06-26 ENCOUNTER — Emergency Department (HOSPITAL_COMMUNITY)
Admission: EM | Admit: 2015-06-26 | Discharge: 2015-06-26 | Disposition: A | Discharge status: Home / Self Care | Payer: Medicaid Other | Attending: Emergency Medicine | Admitting: Emergency Medicine

## 2015-06-26 ENCOUNTER — Encounter (HOSPITAL_COMMUNITY): Payer: Self-pay | Admitting: Emergency Medicine

## 2015-06-26 ENCOUNTER — Emergency Department (HOSPITAL_COMMUNITY): Payer: Medicaid Other

## 2015-06-26 DIAGNOSIS — D571 Sickle-cell disease without crisis: Secondary | ICD-10-CM | POA: Insufficient documentation

## 2015-06-26 DIAGNOSIS — W19XXXA Unspecified fall, initial encounter: Secondary | ICD-10-CM

## 2015-06-26 DIAGNOSIS — Y9389 Activity, other specified: Secondary | ICD-10-CM | POA: Diagnosis not present

## 2015-06-26 DIAGNOSIS — Z792 Long term (current) use of antibiotics: Secondary | ICD-10-CM | POA: Insufficient documentation

## 2015-06-26 DIAGNOSIS — Y9289 Other specified places as the place of occurrence of the external cause: Secondary | ICD-10-CM | POA: Insufficient documentation

## 2015-06-26 DIAGNOSIS — Z23 Encounter for immunization: Secondary | ICD-10-CM | POA: Diagnosis not present

## 2015-06-26 DIAGNOSIS — E079 Disorder of thyroid, unspecified: Secondary | ICD-10-CM | POA: Diagnosis not present

## 2015-06-26 DIAGNOSIS — Z79899 Other long term (current) drug therapy: Secondary | ICD-10-CM | POA: Diagnosis not present

## 2015-06-26 DIAGNOSIS — S0181XA Laceration without foreign body of other part of head, initial encounter: Secondary | ICD-10-CM | POA: Diagnosis not present

## 2015-06-26 DIAGNOSIS — S0083XA Contusion of other part of head, initial encounter: Secondary | ICD-10-CM

## 2015-06-26 DIAGNOSIS — Y998 Other external cause status: Secondary | ICD-10-CM | POA: Insufficient documentation

## 2015-06-26 DIAGNOSIS — I252 Old myocardial infarction: Secondary | ICD-10-CM | POA: Insufficient documentation

## 2015-06-26 DIAGNOSIS — S0993XA Unspecified injury of face, initial encounter: Secondary | ICD-10-CM | POA: Diagnosis present

## 2015-06-26 DIAGNOSIS — Z872 Personal history of diseases of the skin and subcutaneous tissue: Secondary | ICD-10-CM | POA: Insufficient documentation

## 2015-06-26 DIAGNOSIS — W01198A Fall on same level from slipping, tripping and stumbling with subsequent striking against other object, initial encounter: Secondary | ICD-10-CM | POA: Insufficient documentation

## 2015-06-26 DIAGNOSIS — F17200 Nicotine dependence, unspecified, uncomplicated: Secondary | ICD-10-CM | POA: Insufficient documentation

## 2015-06-26 DIAGNOSIS — Z7982 Long term (current) use of aspirin: Secondary | ICD-10-CM | POA: Diagnosis not present

## 2015-06-26 DIAGNOSIS — R51 Headache: Secondary | ICD-10-CM | POA: Insufficient documentation

## 2015-06-26 MED ORDER — TETANUS-DIPHTH-ACELL PERTUSSIS 5-2.5-18.5 LF-MCG/0.5 IM SUSP
0.5000 mL | Freq: Once | INTRAMUSCULAR | Status: AC
  Administered 2015-06-26: 0.5 mL via INTRAMUSCULAR
  Filled 2015-06-26: qty 0.5

## 2015-06-26 MED ORDER — HYDROCODONE-ACETAMINOPHEN 5-325 MG PO TABS
2.0000 | ORAL_TABLET | Freq: Once | ORAL | Status: AC
  Administered 2015-06-26: 2 via ORAL
  Filled 2015-06-26: qty 2
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
## 2015-06-26 NOTE — ED Notes (Signed)
[facility]. Patient states that he slipped on ice and hit his face. States unable to chew, denies dizziness, blurred vision. States headache. Was given tylenol this morning.

## 2015-06-26 NOTE — Discharge Instructions (Signed)
Soft diet until pain improved.  Keep clean, watch for infections.  If you were given medicines take as directed.  If you are on coumadin or contraceptives realize their levels and effectiveness is altered by many different medicines.  If you have any reaction (rash, tongues swelling, other) to the medicines stop taking and see a physician.    If your blood pressure was elevated in the ER make sure you follow up for management with a primary doctor or return for chest pain, shortness of breath or stroke symptoms.  Please follow up as directed and return to the ER or see a physician for new or worsening symptoms.  Thank you. Filed Vitals:   06/26/15 1326  BP: 134/75  Pulse: 67  Temp: 98.5 F (36.9 C)  TempSrc: Oral  Resp: 16  Height: 6\' 1"  (1.854 m)  Weight: 244 lb (110.678 kg)  SpO2: 100%

## 2015-07-07 ENCOUNTER — Emergency Department (HOSPITAL_COMMUNITY)
Admission: EM | Admit: 2015-07-07 | Discharge: 2015-07-07 | Disposition: A | Discharge status: Home / Self Care | Payer: Medicaid Other | Attending: Emergency Medicine | Admitting: Emergency Medicine

## 2015-07-07 ENCOUNTER — Emergency Department (HOSPITAL_COMMUNITY): Payer: Medicaid Other

## 2015-07-07 ENCOUNTER — Encounter (HOSPITAL_COMMUNITY): Payer: Self-pay | Admitting: Emergency Medicine

## 2015-07-07 DIAGNOSIS — R600 Localized edema: Secondary | ICD-10-CM | POA: Diagnosis not present

## 2015-07-07 DIAGNOSIS — I252 Old myocardial infarction: Secondary | ICD-10-CM | POA: Diagnosis not present

## 2015-07-07 DIAGNOSIS — Z87828 Personal history of other (healed) physical injury and trauma: Secondary | ICD-10-CM | POA: Insufficient documentation

## 2015-07-07 DIAGNOSIS — R079 Chest pain, unspecified: Secondary | ICD-10-CM | POA: Diagnosis present

## 2015-07-07 DIAGNOSIS — R0789 Other chest pain: Secondary | ICD-10-CM

## 2015-07-07 DIAGNOSIS — E079 Disorder of thyroid, unspecified: Secondary | ICD-10-CM | POA: Insufficient documentation

## 2015-07-07 DIAGNOSIS — Z79899 Other long term (current) drug therapy: Secondary | ICD-10-CM | POA: Diagnosis not present

## 2015-07-07 DIAGNOSIS — R05 Cough: Secondary | ICD-10-CM | POA: Diagnosis not present

## 2015-07-07 DIAGNOSIS — F17210 Nicotine dependence, cigarettes, uncomplicated: Secondary | ICD-10-CM | POA: Diagnosis not present

## 2015-07-07 DIAGNOSIS — R21 Rash and other nonspecific skin eruption: Secondary | ICD-10-CM | POA: Diagnosis not present

## 2015-07-07 DIAGNOSIS — Z872 Personal history of diseases of the skin and subcutaneous tissue: Secondary | ICD-10-CM | POA: Diagnosis not present

## 2015-07-07 DIAGNOSIS — Z7982 Long term (current) use of aspirin: Secondary | ICD-10-CM | POA: Insufficient documentation

## 2015-07-07 DIAGNOSIS — D649 Anemia, unspecified: Secondary | ICD-10-CM | POA: Insufficient documentation

## 2015-07-07 DIAGNOSIS — R059 Cough, unspecified: Secondary | ICD-10-CM

## 2015-07-07 LAB — COMPREHENSIVE METABOLIC PANEL
ALBUMIN: 3.4 g/dL — AB (ref 3.5–5.0)
ALT: 24 U/L (ref 17–63)
ANION GAP: 7 (ref 5–15)
AST: 29 U/L (ref 15–41)
Alkaline Phosphatase: 91 U/L (ref 38–126)
BILIRUBIN TOTAL: 0.4 mg/dL (ref 0.3–1.2)
BUN: 11 mg/dL (ref 6–20)
CHLORIDE: 109 mmol/L (ref 101–111)
CO2: 26 mmol/L (ref 22–32)
Calcium: 8.4 mg/dL — ABNORMAL LOW (ref 8.9–10.3)
Creatinine, Ser: 0.87 mg/dL (ref 0.61–1.24)
GFR calc Af Amer: >60 mL/min (ref >60)
GFR calc non Af Amer: >60 mL/min (ref >60)
GLUCOSE: 87 mg/dL (ref 65–99)
POTASSIUM: 4 mmol/L (ref 3.5–5.1)
SODIUM: 142 mmol/L (ref 135–145)
TOTAL PROTEIN: 6.6 g/dL (ref 6.5–8.1)

## 2015-07-07 LAB — CBC WITH DIFFERENTIAL/PLATELET
BASOS ABS: 0.1 10*3/uL (ref 0.0–0.1)
Basophils Relative: 1 %
Eosinophils Absolute: 0.5 10*3/uL (ref 0.0–0.7)
Eosinophils Relative: 10 %
HEMATOCRIT: 35.2 % — AB (ref 39.0–52.0)
Hemoglobin: 10.9 g/dL — ABNORMAL LOW (ref 13.0–17.0)
LYMPHS ABS: 1.5 10*3/uL (ref 0.7–4.0)
LYMPHS PCT: 35 %
MCH: 22.2 pg — ABNORMAL LOW (ref 26.0–34.0)
MCHC: 31 g/dL (ref 30.0–36.0)
MCV: 71.7 fL — AB (ref 78.0–100.0)
MONO ABS: 0.5 10*3/uL (ref 0.1–1.0)
Monocytes Relative: 12 %
NEUTROS ABS: 1.8 10*3/uL (ref 1.7–7.7)
Neutrophils Relative %: 42 %
PLATELETS: 202 10*3/uL (ref 150–400)
RBC: 4.91 MIL/uL (ref 4.22–5.81)
RDW: 20.1 % — ABNORMAL HIGH (ref 11.5–15.5)
WBC: 4.4 10*3/uL (ref 4.0–10.5)

## 2015-07-07 LAB — RETICULOCYTES
RBC.: 4.91 MIL/uL (ref 4.22–5.81)
RETIC COUNT ABSOLUTE: 73.7 10*3/uL (ref 19.0–186.0)
RETIC CT PCT: 1.5 % (ref 0.4–3.1)

## 2015-07-07 MED ORDER — AZITHROMYCIN 250 MG PO TABS: 250.0000 mg | ORAL_TABLET | Freq: Every day | ORAL | Status: DC

## 2015-07-07 MED ORDER — AZITHROMYCIN 250 MG PO TABS
500.0000 mg | ORAL_TABLET | Freq: Once | ORAL | Status: AC
  Administered 2015-07-07: 500 mg via ORAL
  Filled 2015-07-07: qty 2

## 2015-07-07 MED ORDER — HYDROCODONE-ACETAMINOPHEN 5-325 MG PO TABS: 2.0000 | ORAL_TABLET | ORAL | Status: DC | PRN

## 2015-07-07 MED ORDER — OXYCODONE-ACETAMINOPHEN 5-325 MG PO TABS
2.0000 | ORAL_TABLET | Freq: Once | ORAL | Status: AC
  Administered 2015-07-07: 2 via ORAL
  Filled 2015-07-07: qty 2

## 2015-07-07 NOTE — ED Notes (Signed)
ekg given to dr. Hyacinth Meeker.

## 2015-07-07 NOTE — Discharge Instructions (Signed)
Please obtain all of your results from medical records or have your doctors office obtain the results - share them with your doctor - you should be seen at your doctors office in the next 2 days. Call today to arrange your follow up. Take the medications as prescribed. Please review all of the medicines and only take them if you do not have an allergy to them. Please be aware that if you are taking birth control pills, taking other prescriptions, ESPECIALLY ANTIBIOTICS may make the birth control ineffective - if this is the case, either do not engage in sexual activity or use alternative methods of birth control such as condoms until you have finished the medicine and your family doctor says it is OK to restart them. If you are on a blood thinner such as COUMADIN, be aware that any other medicine that you take may cause the coumadin to either work too much, or not enough - you should have your coumadin level rechecked in next 7 days if this is the case.  °?  °It is also a possibility that you have an allergic reaction to any of the medicines that you have been prescribed - Everybody reacts differently to medications and while MOST people have no trouble with most medicines, you may have a reaction such as nausea, vomiting, rash, swelling, shortness of breath. If this is the case, please stop taking the medicine immediately and contact your physician.  °?  °You should return to the ER if you develop severe or worsening symptoms.  ° °[facility] Doctor List ° ° ° °Rena Hawkins MD. Specialty: Pulmonary Disease Contact information: [address]  °[address]  °[place] [address]  °[phone]  ° °Margaret Simpson, MD. Specialty: Family Medicine Contact information: [address]  °[place] [address]  °[phone]  ° °Scott Luking, MD. Specialty: Family Medicine Contact information: [address]  °[address]  °[place] [address]  °[phone]  ° °Tesfaye Fanta, MD Specialty:  Internal Medicine Contact information: [address]  °[place] [address]  °[phone]  ° °Zach Hall, MD. Specialty: Internal Medicine Contact information: [address]  °[place] [address]  °[phone]  ° °Angus Mcinnis, MD. Specialty: Family Medicine Contact information: [address]  °[place] [address]  °[phone]  ° °Stephen Knowlton, MD. Specialty: Family Medicine Contact information: [address]  °[address]  °[place] [address]  °[phone]  ° °Roy Fagan, MD. Specialty: Internal Medicine Contact information: [address]  °[address]  ° [place] [address]  °[phone]  ° °
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
## 2015-07-07 NOTE — ED Notes (Signed)
Pt made aware to return if symptoms worsen or if any life threatening symptoms occur.  Pt given pre-pack of vicodin.

## 2015-07-09 ENCOUNTER — Emergency Department (HOSPITAL_COMMUNITY): Payer: Medicaid Other

## 2015-07-09 ENCOUNTER — Emergency Department (HOSPITAL_COMMUNITY)
Admission: EM | Admit: 2015-07-09 | Discharge: 2015-07-10 | Disposition: A | Discharge status: Home / Self Care | Payer: Medicaid Other | Attending: Emergency Medicine | Admitting: Emergency Medicine

## 2015-07-09 ENCOUNTER — Encounter (HOSPITAL_COMMUNITY): Payer: Self-pay | Admitting: *Deleted

## 2015-07-09 DIAGNOSIS — Z79899 Other long term (current) drug therapy: Secondary | ICD-10-CM | POA: Diagnosis not present

## 2015-07-09 DIAGNOSIS — R600 Localized edema: Secondary | ICD-10-CM | POA: Diagnosis not present

## 2015-07-09 DIAGNOSIS — Z872 Personal history of diseases of the skin and subcutaneous tissue: Secondary | ICD-10-CM | POA: Diagnosis not present

## 2015-07-09 DIAGNOSIS — R079 Chest pain, unspecified: Secondary | ICD-10-CM | POA: Diagnosis not present

## 2015-07-09 DIAGNOSIS — Z87828 Personal history of other (healed) physical injury and trauma: Secondary | ICD-10-CM | POA: Diagnosis not present

## 2015-07-09 DIAGNOSIS — F17210 Nicotine dependence, cigarettes, uncomplicated: Secondary | ICD-10-CM | POA: Insufficient documentation

## 2015-07-09 DIAGNOSIS — D649 Anemia, unspecified: Secondary | ICD-10-CM | POA: Insufficient documentation

## 2015-07-09 DIAGNOSIS — R05 Cough: Secondary | ICD-10-CM | POA: Insufficient documentation

## 2015-07-09 DIAGNOSIS — E079 Disorder of thyroid, unspecified: Secondary | ICD-10-CM | POA: Diagnosis not present

## 2015-07-09 DIAGNOSIS — Z7982 Long term (current) use of aspirin: Secondary | ICD-10-CM | POA: Insufficient documentation

## 2015-07-09 DIAGNOSIS — I252 Old myocardial infarction: Secondary | ICD-10-CM | POA: Diagnosis not present

## 2015-07-09 DIAGNOSIS — R059 Cough, unspecified: Secondary | ICD-10-CM

## 2015-07-09 LAB — CBC WITH DIFFERENTIAL/PLATELET
Basophils Absolute: 0.1 K/uL (ref 0.0–0.1)
Basophils Relative: 2 %
Eosinophils Absolute: 0.5 K/uL (ref 0.0–0.7)
Eosinophils Relative: 11 %
HCT: 36.8 % — ABNORMAL LOW (ref 39.0–52.0)
Hemoglobin: 11.3 g/dL — ABNORMAL LOW (ref 13.0–17.0)
Lymphocytes Relative: 47 %
Lymphs Abs: 2 K/uL (ref 0.7–4.0)
MCH: 22.1 pg — ABNORMAL LOW (ref 26.0–34.0)
MCHC: 30.7 g/dL (ref 30.0–36.0)
MCV: 71.9 fL — ABNORMAL LOW (ref 78.0–100.0)
Monocytes Absolute: 0.3 K/uL (ref 0.1–1.0)
Monocytes Relative: 8 %
Neutro Abs: 1.3 K/uL — ABNORMAL LOW (ref 1.7–7.7)
Neutrophils Relative %: 32 %
Platelets: 210 K/uL (ref 150–400)
RBC: 5.12 MIL/uL (ref 4.22–5.81)
RDW: 19.7 % — ABNORMAL HIGH (ref 11.5–15.5)
WBC: 4.2 K/uL (ref 4.0–10.5)

## 2015-07-09 LAB — COMPREHENSIVE METABOLIC PANEL
ALBUMIN: 3.5 g/dL (ref 3.5–5.0)
ALT: 22 U/L (ref 17–63)
ANION GAP: 9 (ref 5–15)
AST: 30 U/L (ref 15–41)
Alkaline Phosphatase: 100 U/L (ref 38–126)
BUN: 11 mg/dL (ref 6–20)
CHLORIDE: 108 mmol/L (ref 101–111)
CO2: 24 mmol/L (ref 22–32)
CREATININE: 0.65 mg/dL (ref 0.61–1.24)
Calcium: 8.4 mg/dL — ABNORMAL LOW (ref 8.9–10.3)
GFR calc Af Amer: >60 mL/min (ref >60)
Glucose, Bld: 105 mg/dL — ABNORMAL HIGH (ref 65–99)
Potassium: 3.7 mmol/L (ref 3.5–5.1)
Sodium: 141 mmol/L (ref 135–145)
Total Bilirubin: 0.3 mg/dL (ref 0.3–1.2)
Total Protein: 6.8 g/dL (ref 6.5–8.1)

## 2015-07-09 LAB — RETICULOCYTES
RBC.: 5.12 MIL/uL (ref 4.22–5.81)
RETIC CT PCT: 1.3 % (ref 0.4–3.1)
Retic Count, Absolute: 66.6 10*3/uL (ref 19.0–186.0)

## 2015-07-09 LAB — I-STAT TROPONIN, ED: Troponin i, poc: 0 ng/mL (ref 0.00–0.08)

## 2015-07-09 MED ORDER — BISACODYL 10 MG RE SUPP: 10.0000 mg | Freq: Once | RECTAL | Status: DC

## 2015-07-09 MED ORDER — OXYCODONE-ACETAMINOPHEN 5-325 MG PO TABS
2.0000 | ORAL_TABLET | Freq: Once | ORAL | Status: AC
  Administered 2015-07-09: 2 via ORAL
  Filled 2015-07-09: qty 2

## 2015-07-09 MED FILL — Hydrocodone-Acetaminophen Tab 5-325 MG: ORAL | Qty: 6 | Status: AC
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
## 2015-07-09 NOTE — ED Notes (Signed)
Pt brought in by [facility] for c/o generalized body aches and multiple complaints; pt c/o chest pain, swelling to bilateral lower legs, abdominal pain

## 2015-07-09 NOTE — ED Notes (Signed)
Pt was given 4 baby aspirin and 3 nitroglycerin tabs prior to ems arrival on scene

## 2015-07-09 NOTE — ED Notes (Signed)
Pt upset that he is not getting more pain medication at this time.  After Dr. Left room states "You can go tell her again I was being rude to you". Told pt "You called me a liar because I said you have been here 7 times" Pt states "You are a liar, I ain't been here no 7 times".

## 2015-07-09 NOTE — ED Notes (Signed)
Pt still complaining of pain, made Dr aware once again.  Dr. In to see pt
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
## 2015-07-15 ENCOUNTER — Observation Stay (HOSPITAL_COMMUNITY)
Admission: EM | Admit: 2015-07-15 | Discharge: 2015-07-16 | Disposition: A | Discharge status: Home / Self Care | Payer: Medicaid Other | Attending: Internal Medicine | Admitting: Internal Medicine

## 2015-07-15 ENCOUNTER — Encounter (HOSPITAL_COMMUNITY): Payer: Self-pay | Admitting: Emergency Medicine

## 2015-07-15 DIAGNOSIS — I252 Old myocardial infarction: Secondary | ICD-10-CM | POA: Diagnosis not present

## 2015-07-15 DIAGNOSIS — R1110 Vomiting, unspecified: Secondary | ICD-10-CM | POA: Insufficient documentation

## 2015-07-15 DIAGNOSIS — Y9389 Activity, other specified: Secondary | ICD-10-CM | POA: Insufficient documentation

## 2015-07-15 DIAGNOSIS — R42 Dizziness and giddiness: Secondary | ICD-10-CM | POA: Insufficient documentation

## 2015-07-15 DIAGNOSIS — E079 Disorder of thyroid, unspecified: Secondary | ICD-10-CM | POA: Diagnosis not present

## 2015-07-15 DIAGNOSIS — D571 Sickle-cell disease without crisis: Secondary | ICD-10-CM | POA: Diagnosis present

## 2015-07-15 DIAGNOSIS — Y998 Other external cause status: Secondary | ICD-10-CM | POA: Diagnosis not present

## 2015-07-15 DIAGNOSIS — M7989 Other specified soft tissue disorders: Secondary | ICD-10-CM

## 2015-07-15 DIAGNOSIS — Z872 Personal history of diseases of the skin and subcutaneous tissue: Secondary | ICD-10-CM | POA: Insufficient documentation

## 2015-07-15 DIAGNOSIS — R112 Nausea with vomiting, unspecified: Secondary | ICD-10-CM

## 2015-07-15 DIAGNOSIS — Z79899 Other long term (current) drug therapy: Secondary | ICD-10-CM | POA: Diagnosis not present

## 2015-07-15 DIAGNOSIS — T63311A Toxic effect of venom of black widow spider, accidental (unintentional), initial encounter: Secondary | ICD-10-CM | POA: Diagnosis present

## 2015-07-15 DIAGNOSIS — D649 Anemia, unspecified: Secondary | ICD-10-CM | POA: Diagnosis not present

## 2015-07-15 DIAGNOSIS — Y9289 Other specified places as the place of occurrence of the external cause: Secondary | ICD-10-CM | POA: Insufficient documentation

## 2015-07-15 DIAGNOSIS — Z7982 Long term (current) use of aspirin: Secondary | ICD-10-CM | POA: Insufficient documentation

## 2015-07-15 MED ORDER — METHYLPREDNISOLONE SODIUM SUCC 125 MG IJ SOLR
125.0000 mg | Freq: Once | INTRAMUSCULAR | Status: DC
  Filled 2015-07-15: qty 2

## 2015-07-15 MED ORDER — DIPHENHYDRAMINE HCL 50 MG/ML IJ SOLN
50.0000 mg | Freq: Once | INTRAMUSCULAR | Status: DC
  Filled 2015-07-15: qty 1

## 2015-07-15 MED ORDER — FAMOTIDINE 20 MG PO TABS
20.0000 mg | ORAL_TABLET | Freq: Once | ORAL | Status: DC
  Administered 2015-07-16: 20 mg via ORAL
  Filled 2015-07-15: qty 1

## 2015-07-15 MED ORDER — ONDANSETRON HCL 4 MG/2ML IJ SOLN
4.0000 mg | Freq: Once | INTRAMUSCULAR | Status: DC
  Filled 2015-07-15: qty 2
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
## 2015-07-15 NOTE — ED Notes (Signed)
Pt states that he saw a spider on his hand around 1330 today and now having swelling of right hand and vomiting

## 2015-07-16 ENCOUNTER — Encounter (HOSPITAL_COMMUNITY): Payer: Self-pay | Admitting: *Deleted

## 2015-07-16 DIAGNOSIS — T63311A Toxic effect of venom of black widow spider, accidental (unintentional), initial encounter: Secondary | ICD-10-CM | POA: Diagnosis present

## 2015-07-16 DIAGNOSIS — E079 Disorder of thyroid, unspecified: Secondary | ICD-10-CM | POA: Diagnosis present

## 2015-07-16 DIAGNOSIS — R1110 Vomiting, unspecified: Secondary | ICD-10-CM | POA: Insufficient documentation

## 2015-07-16 DIAGNOSIS — R112 Nausea with vomiting, unspecified: Secondary | ICD-10-CM | POA: Diagnosis not present

## 2015-07-16 DIAGNOSIS — T63311S Toxic effect of venom of black widow spider, accidental (unintentional), sequela: Secondary | ICD-10-CM

## 2015-07-16 DIAGNOSIS — D571 Sickle-cell disease without crisis: Secondary | ICD-10-CM

## 2015-07-16 LAB — CBC WITH DIFFERENTIAL/PLATELET
BASOS ABS: 0 10*3/uL (ref 0.0–0.1)
Basophils Relative: 1 %
Eosinophils Absolute: 0.4 10*3/uL (ref 0.0–0.7)
Eosinophils Relative: 9 %
HEMATOCRIT: 35.6 % — AB (ref 39.0–52.0)
HEMOGLOBIN: 11.1 g/dL — AB (ref 13.0–17.0)
LYMPHS PCT: 20 %
Lymphs Abs: 1 10*3/uL (ref 0.7–4.0)
MCH: 22.3 pg — ABNORMAL LOW (ref 26.0–34.0)
MCHC: 31.2 g/dL (ref 30.0–36.0)
MCV: 71.6 fL — AB (ref 78.0–100.0)
MONO ABS: 0.3 10*3/uL (ref 0.1–1.0)
MONOS PCT: 6 %
NEUTROS ABS: 3 10*3/uL (ref 1.7–7.7)
NEUTROS PCT: 64 %
Platelets: 183 10*3/uL (ref 150–400)
RBC: 4.97 MIL/uL (ref 4.22–5.81)
RDW: 18.8 % — AB (ref 11.5–15.5)
WBC: 4.7 10*3/uL (ref 4.0–10.5)

## 2015-07-16 LAB — COMPREHENSIVE METABOLIC PANEL
ALBUMIN: 3.4 g/dL — AB (ref 3.5–5.0)
ALT: 31 U/L (ref 17–63)
ANION GAP: 7 (ref 5–15)
AST: 49 U/L — ABNORMAL HIGH (ref 15–41)
Alkaline Phosphatase: 121 U/L (ref 38–126)
BUN: 11 mg/dL (ref 6–20)
CHLORIDE: 105 mmol/L (ref 101–111)
CO2: 25 mmol/L (ref 22–32)
Calcium: 8.5 mg/dL — ABNORMAL LOW (ref 8.9–10.3)
Creatinine, Ser: 0.66 mg/dL (ref 0.61–1.24)
GFR calc Af Amer: >60 mL/min (ref >60)
GFR calc non Af Amer: >60 mL/min (ref >60)
GLUCOSE: 143 mg/dL — AB (ref 65–99)
POTASSIUM: 4.5 mmol/L (ref 3.5–5.1)
SODIUM: 137 mmol/L (ref 135–145)
Total Bilirubin: 0.4 mg/dL (ref 0.3–1.2)
Total Protein: 6.7 g/dL (ref 6.5–8.1)

## 2015-07-16 LAB — RETICULOCYTES
RBC.: 4.97 MIL/uL (ref 4.22–5.81)
RETIC COUNT ABSOLUTE: 34.8 10*3/uL (ref 19.0–186.0)
Retic Ct Pct: 0.7 % (ref 0.4–3.1)

## 2015-07-16 MED ORDER — ALUM & MAG HYDROXIDE-SIMETH 200-200-20 MG/5ML PO SUSP: 30.0000 mL | Freq: Four times a day (QID) | ORAL | Status: DC | PRN

## 2015-07-16 MED ORDER — DIAZEPAM 5 MG/ML IJ SOLN
5.0000 mg | Freq: Once | INTRAMUSCULAR | Status: AC
  Administered 2015-07-16: 5 mg via INTRAVENOUS
  Filled 2015-07-16: qty 2

## 2015-07-16 MED ORDER — HYDROCODONE-ACETAMINOPHEN 5-325 MG PO TABS: 1.0000 | ORAL_TABLET | ORAL | Status: DC | PRN

## 2015-07-16 MED ORDER — METHYLPREDNISOLONE SODIUM SUCC 125 MG IJ SOLR
125.0000 mg | Freq: Once | INTRAMUSCULAR | Status: AC
  Administered 2015-07-16: 125 mg via INTRAVENOUS

## 2015-07-16 MED ORDER — FENTANYL CITRATE (PF) 100 MCG/2ML IJ SOLN
INTRAMUSCULAR | Status: AC
  Administered 2015-07-16: 50 ug
  Filled 2015-07-16: qty 2

## 2015-07-16 MED ORDER — ONDANSETRON HCL 4 MG PO TABS: 4.0000 mg | ORAL_TABLET | Freq: Four times a day (QID) | ORAL | Status: DC | PRN

## 2015-07-16 MED ORDER — LEVOTHYROXINE SODIUM 50 MCG PO TABS
50.0000 ug | ORAL_TABLET | Freq: Every day | ORAL | Status: DC
  Administered 2015-07-16: 50 ug via ORAL
  Filled 2015-07-16: qty 1

## 2015-07-16 MED ORDER — ONDANSETRON HCL 4 MG/2ML IJ SOLN
4.0000 mg | Freq: Once | INTRAMUSCULAR | Status: AC
  Administered 2015-07-16: 4 mg via INTRAVENOUS

## 2015-07-16 MED ORDER — CARBAMAZEPINE 200 MG PO TABS
200.0000 mg | ORAL_TABLET | Freq: Two times a day (BID) | ORAL | Status: DC
  Administered 2015-07-16: 200 mg via ORAL
  Filled 2015-07-16: qty 1

## 2015-07-16 MED ORDER — DIPHENHYDRAMINE HCL 50 MG/ML IJ SOLN
50.0000 mg | Freq: Once | INTRAMUSCULAR | Status: AC
  Administered 2015-07-16: 50 mg via INTRAVENOUS

## 2015-07-16 MED ORDER — LEVETIRACETAM 500 MG PO TABS
500.0000 mg | ORAL_TABLET | Freq: Two times a day (BID) | ORAL | Status: DC
  Administered 2015-07-16: 500 mg via ORAL
  Filled 2015-07-16: qty 1

## 2015-07-16 MED ORDER — METOPROLOL TARTRATE 25 MG PO TABS
12.5000 mg | ORAL_TABLET | Freq: Two times a day (BID) | ORAL | Status: DC
  Administered 2015-07-16: 12.5 mg via ORAL
  Filled 2015-07-16: qty 1

## 2015-07-16 MED ORDER — FAMOTIDINE 20 MG PO TABS
20.0000 mg | ORAL_TABLET | Freq: Two times a day (BID) | ORAL | Status: DC
  Administered 2015-07-16: 20 mg via ORAL
  Filled 2015-07-16: qty 1

## 2015-07-16 MED ORDER — SODIUM CHLORIDE 0.9 % IV SOLN
INTRAVENOUS | Status: DC
  Administered 2015-07-16: 06:00:00 via INTRAVENOUS

## 2015-07-16 MED ORDER — GABAPENTIN 300 MG PO CAPS
300.0000 mg | ORAL_CAPSULE | Freq: Three times a day (TID) | ORAL | Status: DC
  Administered 2015-07-16: 300 mg via ORAL
  Filled 2015-07-16: qty 1

## 2015-07-16 MED ORDER — HYDROMORPHONE HCL 1 MG/ML IJ SOLN
1.0000 mg | INTRAMUSCULAR | Status: DC | PRN
  Administered 2015-07-16: 1 mg via INTRAVENOUS
  Filled 2015-07-16: qty 1

## 2015-07-16 MED ORDER — HYDROCODONE-ACETAMINOPHEN 5-325 MG PO TABS
2.0000 | ORAL_TABLET | ORAL | Status: DC | PRN
  Administered 2015-07-16 (×2): 2 via ORAL
  Filled 2015-07-16 (×2): qty 2

## 2015-07-16 MED ORDER — METOCLOPRAMIDE HCL 5 MG/ML IJ SOLN
10.0000 mg | Freq: Once | INTRAMUSCULAR | Status: AC
  Administered 2015-07-16: 10 mg via INTRAVENOUS
  Filled 2015-07-16: qty 2

## 2015-07-16 MED ORDER — FAMOTIDINE IN NACL 20-0.9 MG/50ML-% IV SOLN: 20.0000 mg | Freq: Once | INTRAVENOUS | Status: DC

## 2015-07-16 MED ORDER — TRAZODONE HCL 50 MG PO TABS
50.0000 mg | ORAL_TABLET | Freq: Three times a day (TID) | ORAL | Status: DC
  Administered 2015-07-16: 50 mg via ORAL
  Filled 2015-07-16: qty 1

## 2015-07-16 MED ORDER — ONDANSETRON HCL 4 MG/2ML IJ SOLN
4.0000 mg | Freq: Four times a day (QID) | INTRAMUSCULAR | Status: DC | PRN
Start: 2015-07-16 — End: 2015-07-16
  Administered 2015-07-16: 4 mg via INTRAVENOUS
  Filled 2015-07-16: qty 2

## 2015-07-16 MED ORDER — FENTANYL CITRATE (PF) 100 MCG/2ML IJ SOLN
50.0000 ug | Freq: Once | INTRAMUSCULAR | Status: AC
  Administered 2015-07-16: 50 ug via INTRAVENOUS
  Filled 2015-07-16: qty 2

## 2015-07-16 NOTE — Progress Notes (Signed)
Patient reported vomiting, no emesis in toilet, emesis bag given, Hospitalist to room and aware, tray delivered, patient eating. Will continue to monitor.

## 2015-07-16 NOTE — Progress Notes (Signed)
IV access removed.  Discharge instuctions reviewed with patient and caregiver, questions answered, understanding verbalized.  Waiting paper scripts for discharge.

## 2015-07-16 NOTE — Progress Notes (Signed)
Script completed by [facility] and given patient.  Discharged via wheelchair accompanied by staff for discharge to assisted living by their staff.  Stable at discharge.

## 2015-07-16 NOTE — NC FL2 (Signed)
[place] MEDICAID [id number] LEVEL OF CARE SCREENING TOOL     IDENTIFICATION  Patient Name: Mark Walker Birthdate: 21-Feb-1966 Sex: male Admission Date (Current Location): 07/15/2015  [facility] and [place] Number:  [employer] and Address:  [facility],  [address], [place] [address]      Provider Number: [id number]  Attending Physician Name and Address:  Zannie Cove, MD  Relative Name and Phone Number:       Current Level of Care: Hospital Recommended Level of Care: Family Care Home Prior Approval Number:    Date Approved/Denied:   PASRR Number:    Discharge Plan: Other (Comment) [facility])    Current Diagnoses: Patient Active Problem List   Diagnosis Date Noted  . Black widow spider bite 07/16/2015  . Sickle cell anemia (HCC)   . Thyroid disease   . Emesis     Orientation RESPIRATION BLADDER Height & Weight     Self, Time  Normal Continent Weight: 261 lb 7.5 oz (118.6 kg) Height:   (185.4 cm)  BEHAVIORAL SYMPTOMS/MOOD NEUROLOGICAL BOWEL NUTRITION STATUS      Continent    AMBULATORY STATUS COMMUNICATION OF NEEDS Skin     Verbally Normal                       Personal Care Assistance Level of Assistance              Functional Limitations Info             SPECIAL CARE FACTORS FREQUENCY                       Contractures      Additional Factors Info                  Current Medications (07/16/2015):  This is the current hospital active medication list Current Facility-Administered Medications  Medication Dose Route Frequency Provider Last Rate Last Dose  . 0.9 %  sodium chloride infusion   Intravenous Continuous Haydee Monica, MD 100 mL/hr at 07/16/15 0542    . alum & mag hydroxide-simeth (MAALOX/MYLANTA) 200-200-20 MG/5ML suspension 30 mL  30 mL Oral Q6H PRN Haydee Monica, MD      . carbamazepine (TEGRETOL) tablet 200 mg  200 mg Oral BID Haydee Monica, MD   200 mg at 07/16/15 0941  . famotidine  (PEPCID) tablet 20 mg  20 mg Oral BID Haydee Monica, MD   20 mg at 07/16/15 7829  . gabapentin (NEURONTIN) capsule 300 mg  300 mg Oral TID Haydee Monica, MD   300 mg at 07/16/15 0940  . HYDROcodone-acetaminophen (NORCO/VICODIN) 5-325 MG per tablet 2 tablet  2 tablet Oral Q4H PRN Haydee Monica, MD   2 tablet at 07/16/15 [phone]  . HYDROmorphone (DILAUDID) injection 1 mg  1 mg Intravenous Q2H PRN Haydee Monica, MD   1 mg at 07/16/15 0704  . levETIRAcetam (KEPPRA) tablet 500 mg  500 mg Oral BID Haydee Monica, MD   500 mg at 07/16/15 3086  . levothyroxine (SYNTHROID, LEVOTHROID) tablet 50 mcg  50 mcg Oral QAC breakfast Haydee Monica, MD   50 mcg at 07/16/15 0810  . metoprolol tartrate (LOPRESSOR) tablet 12.5 mg  12.5 mg Oral BID Haydee Monica, MD   12.5 mg at 07/16/15 0939  . ondansetron (ZOFRAN) tablet 4 mg  4 mg Oral Q6H PRN Haydee Monica,  MD       Or  . ondansetron (ZOFRAN) injection 4 mg  4 mg Intravenous Q6H PRN Haydee Monica, MD   4 mg at 07/16/15 0703  . traZODone (DESYREL) tablet 50 mg  50 mg Oral TID Haydee Monica, MD   50 mg at 07/16/15 4098     Discharge Medications: Please see discharge summary for a list of discharge medications.  Relevant Imaging Results:  Relevant Lab Results:   Additional Information    Liliana Cline, LCSW
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
## 2015-07-28 ENCOUNTER — Emergency Department (HOSPITAL_COMMUNITY)
Admission: EM | Admit: 2015-07-28 | Discharge: 2015-07-28 | Disposition: A | Discharge status: Home / Self Care | Payer: Medicaid Other | Attending: Emergency Medicine | Admitting: Emergency Medicine

## 2015-07-28 ENCOUNTER — Encounter (HOSPITAL_COMMUNITY): Payer: Self-pay | Admitting: Emergency Medicine

## 2015-07-28 DIAGNOSIS — F17210 Nicotine dependence, cigarettes, uncomplicated: Secondary | ICD-10-CM | POA: Insufficient documentation

## 2015-07-28 DIAGNOSIS — R509 Fever, unspecified: Secondary | ICD-10-CM | POA: Insufficient documentation

## 2015-07-28 DIAGNOSIS — R61 Generalized hyperhidrosis: Secondary | ICD-10-CM | POA: Diagnosis not present

## 2015-07-28 DIAGNOSIS — Z87828 Personal history of other (healed) physical injury and trauma: Secondary | ICD-10-CM | POA: Insufficient documentation

## 2015-07-28 DIAGNOSIS — Z79899 Other long term (current) drug therapy: Secondary | ICD-10-CM | POA: Insufficient documentation

## 2015-07-28 DIAGNOSIS — W57XXXD Bitten or stung by nonvenomous insect and other nonvenomous arthropods, subsequent encounter: Secondary | ICD-10-CM | POA: Diagnosis not present

## 2015-07-28 DIAGNOSIS — I252 Old myocardial infarction: Secondary | ICD-10-CM | POA: Insufficient documentation

## 2015-07-28 DIAGNOSIS — D571 Sickle-cell disease without crisis: Secondary | ICD-10-CM | POA: Diagnosis not present

## 2015-07-28 DIAGNOSIS — M79641 Pain in right hand: Secondary | ICD-10-CM | POA: Insufficient documentation

## 2015-07-28 DIAGNOSIS — Z7982 Long term (current) use of aspirin: Secondary | ICD-10-CM | POA: Diagnosis not present

## 2015-07-28 DIAGNOSIS — R112 Nausea with vomiting, unspecified: Secondary | ICD-10-CM | POA: Diagnosis not present

## 2015-07-28 DIAGNOSIS — E079 Disorder of thyroid, unspecified: Secondary | ICD-10-CM | POA: Diagnosis not present

## 2015-07-28 DIAGNOSIS — Z872 Personal history of diseases of the skin and subcutaneous tissue: Secondary | ICD-10-CM | POA: Insufficient documentation

## 2015-07-28 DIAGNOSIS — T63301D Toxic effect of unspecified spider venom, accidental (unintentional), subsequent encounter: Secondary | ICD-10-CM

## 2015-07-28 DIAGNOSIS — S60561D Insect bite (nonvenomous) of right hand, subsequent encounter: Secondary | ICD-10-CM | POA: Insufficient documentation

## 2015-07-28 LAB — BASIC METABOLIC PANEL
Anion gap: 5 (ref 5–15)
BUN: 12 mg/dL (ref 6–20)
CO2: 27 mmol/L (ref 22–32)
Calcium: 8.4 mg/dL — ABNORMAL LOW (ref 8.9–10.3)
Chloride: 108 mmol/L (ref 101–111)
Creatinine, Ser: 0.82 mg/dL (ref 0.61–1.24)
GFR calc Af Amer: >60 mL/min (ref >60)
GFR calc non Af Amer: >60 mL/min (ref >60)
Glucose, Bld: 92 mg/dL (ref 65–99)
Potassium: 4 mmol/L (ref 3.5–5.1)
Sodium: 140 mmol/L (ref 135–145)

## 2015-07-28 LAB — CBC WITH DIFFERENTIAL/PLATELET
Basophils Absolute: 0.1 10*3/uL (ref 0.0–0.1)
Basophils Relative: 1 %
EOS PCT: 8 %
Eosinophils Absolute: 0.3 10*3/uL (ref 0.0–0.7)
HCT: 36.9 % — ABNORMAL LOW (ref 39.0–52.0)
Hemoglobin: 11.5 g/dL — ABNORMAL LOW (ref 13.0–17.0)
LYMPHS ABS: 1.3 10*3/uL (ref 0.7–4.0)
LYMPHS PCT: 35 %
MCH: 22.9 pg — AB (ref 26.0–34.0)
MCHC: 31.2 g/dL (ref 30.0–36.0)
MCV: 73.4 fL — AB (ref 78.0–100.0)
MONO ABS: 0.2 10*3/uL (ref 0.1–1.0)
MONOS PCT: 6 %
Neutro Abs: 1.9 10*3/uL (ref 1.7–7.7)
Neutrophils Relative %: 50 %
PLATELETS: 218 10*3/uL (ref 150–400)
RBC: 5.03 MIL/uL (ref 4.22–5.81)
RDW: 18.2 % — AB (ref 11.5–15.5)
WBC: 3.7 10*3/uL — AB (ref 4.0–10.5)

## 2015-07-28 LAB — I-STAT CG4 LACTIC ACID, ED: Lactic Acid, Venous: 0.76 mmol/L (ref 0.5–2.0)

## 2015-07-28 MED ORDER — CEFAZOLIN SODIUM 1-5 GM-% IV SOLN
1.0000 g | Freq: Once | INTRAVENOUS | Status: AC
  Administered 2015-07-28: 1 g via INTRAVENOUS
  Filled 2015-07-28: qty 50

## 2015-07-28 MED ORDER — DEXAMETHASONE SODIUM PHOSPHATE 10 MG/ML IJ SOLN
10.0000 mg | Freq: Once | INTRAMUSCULAR | Status: AC
  Administered 2015-07-28: 10 mg via INTRAVENOUS
  Filled 2015-07-28: qty 1

## 2015-07-28 MED ORDER — ONDANSETRON HCL 4 MG/2ML IJ SOLN
4.0000 mg | Freq: Once | INTRAMUSCULAR | Status: AC
  Administered 2015-07-28: 4 mg via INTRAVENOUS
  Filled 2015-07-28: qty 2

## 2015-07-28 MED ORDER — CEPHALEXIN 500 MG PO CAPS: 500.0000 mg | ORAL_CAPSULE | Freq: Four times a day (QID) | ORAL | Status: DC

## 2015-07-28 MED ORDER — TRAMADOL HCL 50 MG PO TABS: 50.0000 mg | ORAL_TABLET | Freq: Four times a day (QID) | ORAL | Status: DC | PRN

## 2015-07-28 MED ORDER — MORPHINE SULFATE (PF) 4 MG/ML IV SOLN
4.0000 mg | INTRAVENOUS | Status: DC | PRN
  Administered 2015-07-28: 4 mg via INTRAVENOUS
  Filled 2015-07-28: qty 1

## 2015-07-28 MED ORDER — PREDNISONE 20 MG PO TABS: 20.0000 mg | ORAL_TABLET | Freq: Two times a day (BID) | ORAL | Status: DC
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
## 2015-07-28 NOTE — ED Notes (Addendum)
Pt states that he was bitten by a spider on his right hand over a week ago and was admitted to the hospital.  States that his hand is better but he has finished his pills and it is still swollen and painful.  Pt is profusely diaphoretic and states that started early this morning along with vomiting.  Denies chest pain.

## 2015-07-28 NOTE — ED Notes (Signed)
Patient requesting prescription for something for pain. Dr Fayrene Fearing made aware.

## 2015-07-28 NOTE — Discharge Instructions (Signed)
Keep ace wrap on your hand.   Elevate your hand whenever possible to limit swelling.

## 2015-08-04 ENCOUNTER — Emergency Department (HOSPITAL_COMMUNITY)
Admission: EM | Admit: 2015-08-04 | Discharge: 2015-08-04 | Disposition: A | Discharge status: Home / Self Care | Payer: Medicaid Other | Attending: Emergency Medicine | Admitting: Emergency Medicine

## 2015-08-04 ENCOUNTER — Emergency Department (HOSPITAL_COMMUNITY): Payer: Medicaid Other

## 2015-08-04 ENCOUNTER — Encounter (HOSPITAL_COMMUNITY): Payer: Self-pay | Admitting: Emergency Medicine

## 2015-08-04 DIAGNOSIS — M791 Myalgia: Secondary | ICD-10-CM | POA: Insufficient documentation

## 2015-08-04 DIAGNOSIS — R059 Cough, unspecified: Secondary | ICD-10-CM

## 2015-08-04 DIAGNOSIS — F17210 Nicotine dependence, cigarettes, uncomplicated: Secondary | ICD-10-CM | POA: Insufficient documentation

## 2015-08-04 DIAGNOSIS — R05 Cough: Secondary | ICD-10-CM | POA: Insufficient documentation

## 2015-08-04 DIAGNOSIS — Z862 Personal history of diseases of the blood and blood-forming organs and certain disorders involving the immune mechanism: Secondary | ICD-10-CM | POA: Diagnosis not present

## 2015-08-04 DIAGNOSIS — R6883 Chills (without fever): Secondary | ICD-10-CM | POA: Diagnosis not present

## 2015-08-04 DIAGNOSIS — Z8639 Personal history of other endocrine, nutritional and metabolic disease: Secondary | ICD-10-CM | POA: Insufficient documentation

## 2015-08-04 DIAGNOSIS — I252 Old myocardial infarction: Secondary | ICD-10-CM | POA: Insufficient documentation

## 2015-08-04 LAB — CBC WITH DIFFERENTIAL/PLATELET
Basophils Absolute: 0 10*3/uL (ref 0.0–0.1)
Basophils Relative: 1 %
EOS ABS: 0.1 10*3/uL (ref 0.0–0.7)
EOS PCT: 1 %
HCT: 42.6 % (ref 39.0–52.0)
Hemoglobin: 13.5 g/dL (ref 13.0–17.0)
LYMPHS ABS: 1.5 10*3/uL (ref 0.7–4.0)
Lymphocytes Relative: 24 %
MCH: 23.4 pg — AB (ref 26.0–34.0)
MCHC: 31.7 g/dL (ref 30.0–36.0)
MCV: 73.8 fL — ABNORMAL LOW (ref 78.0–100.0)
Monocytes Absolute: 0.3 10*3/uL (ref 0.1–1.0)
Monocytes Relative: 5 %
Neutro Abs: 4.5 10*3/uL (ref 1.7–7.7)
Neutrophils Relative %: 69 %
PLATELETS: 215 10*3/uL (ref 150–400)
RBC: 5.77 MIL/uL (ref 4.22–5.81)
RDW: 18.3 % — AB (ref 11.5–15.5)
WBC: 6.5 10*3/uL (ref 4.0–10.5)

## 2015-08-04 LAB — COMPREHENSIVE METABOLIC PANEL
ALT: 35 U/L (ref 17–63)
ANION GAP: 12 (ref 5–15)
AST: 37 U/L (ref 15–41)
Albumin: 3.7 g/dL (ref 3.5–5.0)
Alkaline Phosphatase: 94 U/L (ref 38–126)
BUN: 15 mg/dL (ref 6–20)
CHLORIDE: 105 mmol/L (ref 101–111)
CO2: 24 mmol/L (ref 22–32)
CREATININE: 0.85 mg/dL (ref 0.61–1.24)
Calcium: 8.7 mg/dL — ABNORMAL LOW (ref 8.9–10.3)
GFR calc non Af Amer: >60 mL/min (ref >60)
Glucose, Bld: 220 mg/dL — ABNORMAL HIGH (ref 65–99)
POTASSIUM: 3.9 mmol/L (ref 3.5–5.1)
SODIUM: 141 mmol/L (ref 135–145)
Total Bilirubin: 0.3 mg/dL (ref 0.3–1.2)
Total Protein: 7.2 g/dL (ref 6.5–8.1)

## 2015-08-04 LAB — INFLUENZA PANEL BY PCR (TYPE A & B)
H1N1FLUPCR: NOT DETECTED
INFLBPCR: NEGATIVE
Influenza A By PCR: NEGATIVE

## 2015-08-04 MED ORDER — BENZONATATE 100 MG PO CAPS: 100.0000 mg | ORAL_CAPSULE | Freq: Three times a day (TID) | ORAL | Status: DC

## 2015-08-04 MED ORDER — OXYCODONE-ACETAMINOPHEN 5-325 MG PO TABS
2.0000 | ORAL_TABLET | Freq: Once | ORAL | Status: AC
Start: 2015-08-04 — End: 2015-08-04
  Administered 2015-08-04: 2 via ORAL
  Filled 2015-08-04: qty 2

## 2015-08-04 MED ORDER — HYDROCODONE-ACETAMINOPHEN 5-325 MG PO TABS
2.0000 | ORAL_TABLET | Freq: Once | ORAL | Status: AC
  Administered 2015-08-04: 2 via ORAL
  Filled 2015-08-04: qty 2

## 2015-08-04 NOTE — ED Notes (Signed)
Pt currently in XR.

## 2015-08-04 NOTE — Discharge Instructions (Signed)
Your chest x-ray and blood work were normal, please take the cough medication as needed, use over-the-counter medications for pain, we cannot prescribe you opiate or narcotic medications because of your chronic pain.  Return to ER for worsening symptoms.

## 2015-08-04 NOTE — ED Notes (Signed)
Pt returned from XR and is now in room.

## 2015-08-04 NOTE — ED Notes (Signed)
Pt made aware to return if symptoms worsen or if any life threatening symptoms occur.   

## 2015-08-04 NOTE — ED Notes (Signed)
Patient c/o productive cough with occasional thick green sputum, fevers, and body aches. Per patient started last night. Patient states "I have sickle cell and it's really easy for me to get pneumonia." Patient afebrile in triage but diaphoretic. Per Patient short of breath and chest pain when coughing.
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
## 2015-08-06 ENCOUNTER — Emergency Department (HOSPITAL_COMMUNITY)
Admission: EM | Admit: 2015-08-06 | Discharge: 2015-08-06 | Discharge status: Against Medical Advice | Payer: Medicaid Other | Attending: Emergency Medicine | Admitting: Emergency Medicine

## 2015-08-06 ENCOUNTER — Encounter (HOSPITAL_COMMUNITY): Payer: Self-pay | Admitting: *Deleted

## 2015-08-06 ENCOUNTER — Emergency Department (HOSPITAL_COMMUNITY): Payer: Medicaid Other

## 2015-08-06 DIAGNOSIS — G8929 Other chronic pain: Secondary | ICD-10-CM | POA: Diagnosis not present

## 2015-08-06 DIAGNOSIS — Z87828 Personal history of other (healed) physical injury and trauma: Secondary | ICD-10-CM | POA: Insufficient documentation

## 2015-08-06 DIAGNOSIS — R451 Restlessness and agitation: Secondary | ICD-10-CM | POA: Diagnosis not present

## 2015-08-06 DIAGNOSIS — F911 Conduct disorder, childhood-onset type: Secondary | ICD-10-CM | POA: Insufficient documentation

## 2015-08-06 DIAGNOSIS — D649 Anemia, unspecified: Secondary | ICD-10-CM | POA: Diagnosis not present

## 2015-08-06 DIAGNOSIS — I252 Old myocardial infarction: Secondary | ICD-10-CM | POA: Diagnosis not present

## 2015-08-06 DIAGNOSIS — Z7982 Long term (current) use of aspirin: Secondary | ICD-10-CM | POA: Insufficient documentation

## 2015-08-06 DIAGNOSIS — F17210 Nicotine dependence, cigarettes, uncomplicated: Secondary | ICD-10-CM | POA: Diagnosis not present

## 2015-08-06 DIAGNOSIS — Z79899 Other long term (current) drug therapy: Secondary | ICD-10-CM | POA: Diagnosis not present

## 2015-08-06 DIAGNOSIS — Z872 Personal history of diseases of the skin and subcutaneous tissue: Secondary | ICD-10-CM | POA: Insufficient documentation

## 2015-08-06 DIAGNOSIS — R0602 Shortness of breath: Secondary | ICD-10-CM | POA: Diagnosis present

## 2015-08-06 DIAGNOSIS — J069 Acute upper respiratory infection, unspecified: Secondary | ICD-10-CM | POA: Diagnosis not present

## 2015-08-06 DIAGNOSIS — R4689 Other symptoms and signs involving appearance and behavior: Secondary | ICD-10-CM

## 2015-08-06 DIAGNOSIS — R4182 Altered mental status, unspecified: Secondary | ICD-10-CM

## 2015-08-06 DIAGNOSIS — Z7952 Long term (current) use of systemic steroids: Secondary | ICD-10-CM | POA: Diagnosis not present

## 2015-08-06 DIAGNOSIS — E079 Disorder of thyroid, unspecified: Secondary | ICD-10-CM | POA: Insufficient documentation

## 2015-08-06 DIAGNOSIS — Z792 Long term (current) use of antibiotics: Secondary | ICD-10-CM | POA: Insufficient documentation

## 2015-08-06 LAB — HEMOGLOBINOPATHY EVALUATION
HGB S QUANTITAION: 32.6 % — AB
Hgb A2 Quant: 3.9 % — ABNORMAL HIGH (ref 0.7–3.1)
Hgb A: 63.5 % — ABNORMAL LOW (ref 94.0–98.0)
Hgb C: 0 %
Hgb F Quant: 0 % (ref 0.0–2.0)
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
## 2015-08-06 NOTE — ED Notes (Signed)
Went into pt's room to sign pt out and room was empty,

## 2015-08-06 NOTE — Discharge Instructions (Signed)
Chronic Pain  Chronic pain can be defined as pain that is off and on and lasts for 3-6 months or longer. Many things cause chronic pain, which can make it difficult to make a diagnosis. There are many treatment options available for chronic pain. However, finding a treatment that works well for you may require trying various approaches until the right one is found. Many people benefit from a combination of two or more types of treatment to control their pain.  SYMPTOMS   Chronic pain can occur anywhere in the body and can range from mild to very severe. Some types of chronic pain include:  · Headache.  · Low back pain.  · Cancer pain.  · Arthritis pain.  · Neurogenic pain. This is pain resulting from damage to nerves.   People with chronic pain may also have other symptoms such as:  · Depression.  · Anger.  · Insomnia.  · Anxiety.  DIAGNOSIS   Your health care provider will help diagnose your condition over time. In many cases, the initial focus will be on excluding possible conditions that could be causing the pain. Depending on your symptoms, your health care provider may order tests to diagnose your condition. Some of these tests may include:   · Blood tests.    · CT scan.    · MRI.    · X-rays.    · Ultrasounds.    · Nerve conduction studies.    You may need to see a specialist.   TREATMENT   Finding treatment that works well may take time. You may be referred to a pain specialist. He or she may prescribe medicine or therapies, such as:   · Mindful meditation or yoga.  · Shots (injections) of numbing or pain-relieving medicines into the spine or area of pain.  · Local electrical stimulation.  · Acupuncture.    · Massage therapy.    · Aroma, color, light, or sound therapy.    · Biofeedback.    · Working with a physical therapist to keep from getting stiff.    · Regular, gentle exercise.    · Cognitive or behavioral therapy.    · Group support.    Sometimes, surgery may be recommended.   HOME CARE INSTRUCTIONS    · Take all medicines as directed by your health care provider.    · Lessen stress in your life by relaxing and doing things such as listening to calming music.    · Exercise or be active as directed by your health care provider.    · Eat a healthy diet and include things such as vegetables, fruits, fish, and lean meats in your diet.    · Keep all follow-up appointments with your health care provider.    · Attend a support group with others suffering from chronic pain.  SEEK MEDICAL CARE IF:   · Your pain gets worse.    · You develop a new pain that was not there before.    · You cannot tolerate medicines given to you by your health care provider.    · You have new symptoms since your last visit with your health care provider.    SEEK IMMEDIATE MEDICAL CARE IF:   · You feel weak.    · You have decreased sensation or numbness.    · You lose control of bowel or bladder function.    · Your pain suddenly gets much worse.    · You develop shaking.  · You develop chills.  · You develop confusion.  · You develop chest pain.  · You develop shortness of breath.    MAKE SURE YOU:  ·   Document Revised: 02/02/2013 Document Reviewed: 11/26/2012 Elsevier Interactive Patient Education 2016 [employer]  Viral Infections A viral infection can be caused by different types of viruses.Most viral infections are not serious and resolve on their own. However, some infections may cause severe symptoms and may lead to further complications. SYMPTOMS Viruses can frequently cause:  Minor sore throat.  Aches and pains.  Headaches.  Runny nose.  Different types of rashes.  Watery  eyes.  Tiredness.  Cough.  Loss of appetite.  Gastrointestinal infections, resulting in nausea, vomiting, and diarrhea. These symptoms do not respond to antibiotics because the infection is not caused by bacteria. However, you might catch a bacterial infection following the viral infection. This is sometimes called a "superinfection." Symptoms of such a bacterial infection may include:  Worsening sore throat with pus and difficulty swallowing.  Swollen neck glands.  Chills and a high or persistent fever.  Severe headache.  Tenderness over the sinuses.  Persistent overall ill feeling (malaise), muscle aches, and tiredness (fatigue).  Persistent cough.  Yellow, green, or brown mucus production with coughing. HOME CARE INSTRUCTIONS   Only take over-the-counter or prescription medicines for pain, discomfort, diarrhea, or fever as directed by your caregiver.  Drink enough water and fluids to keep your urine clear or pale yellow. Sports drinks can provide valuable electrolytes, sugars, and hydration.  Get plenty of rest and maintain proper nutrition. Soups and broths with crackers or rice are fine. SEEK IMMEDIATE MEDICAL CARE IF:   You have severe headaches, shortness of breath, chest pain, neck pain, or an unusual rash.  You have uncontrolled vomiting, diarrhea, or you are unable to keep down fluids.  You or your child has an oral temperature above 102 F (38.9 C), not controlled by medicine.  Your baby is older than 3 months with a rectal temperature of 102 F (38.9 C) or higher.  Your baby is 75 months old or younger with a rectal temperature of 100.4 F (38 C) or higher. MAKE SURE YOU:   Understand these instructions.  Will watch your condition.  Will get help right away if you are not doing well or get worse.   This information is not intended to replace advice given to you by your health care provider. Make sure you discuss any questions you have with your health  care provider.   Document Released: 03/12/2005 Document Revised: 08/25/2011 Document Reviewed: 11/08/2014 Elsevier Interactive Patient Education 2016 [employer].    Acknowledgement of Risk of Discharge  Against Medical Advice   And Release of Liability    Because I am choosing to leave the hospital in spite of these risks, I release the hospital, its employees and officers, and my attending physician from all liability for any adverse results caused by my leaving the hospital prematurely.   ________________________________      _____________________________________ Patient's Signature                                        Date and Time   ________________________________      _____________________________________ Witness' Signature                                        Relationship to Patient    ________________________________      _____________________________________ Witness' Signature  Relationship to Patient   [If patient refuses to sign, write "Patient refused to sign" on the patient's signature line and have witnesses to the refusal sign as witnesses.]

## 2015-08-06 NOTE — ED Notes (Signed)
Pt refuses to allow any further testing, Dr Elesa Massed and security at bedside speaking with pt, pt refuses to stay,
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
## 2015-08-07 LAB — SICKLE CELL SCREEN: SICKLE CELL SCREEN: POSITIVE — AB

## 2015-08-11 ENCOUNTER — Emergency Department (HOSPITAL_COMMUNITY): Payer: Medicaid Other

## 2015-08-11 ENCOUNTER — Encounter (HOSPITAL_COMMUNITY): Payer: Self-pay | Admitting: *Deleted

## 2015-08-11 ENCOUNTER — Emergency Department (HOSPITAL_COMMUNITY)
Admission: EM | Admit: 2015-08-11 | Discharge: 2015-08-11 | Disposition: A | Discharge status: Home / Self Care | Payer: Medicaid Other | Attending: Emergency Medicine | Admitting: Emergency Medicine

## 2015-08-11 DIAGNOSIS — Z7982 Long term (current) use of aspirin: Secondary | ICD-10-CM | POA: Insufficient documentation

## 2015-08-11 DIAGNOSIS — Y92481 Parking lot as the place of occurrence of the external cause: Secondary | ICD-10-CM | POA: Diagnosis not present

## 2015-08-11 DIAGNOSIS — I252 Old myocardial infarction: Secondary | ICD-10-CM | POA: Insufficient documentation

## 2015-08-11 DIAGNOSIS — F17210 Nicotine dependence, cigarettes, uncomplicated: Secondary | ICD-10-CM | POA: Insufficient documentation

## 2015-08-11 DIAGNOSIS — M542 Cervicalgia: Secondary | ICD-10-CM | POA: Insufficient documentation

## 2015-08-11 DIAGNOSIS — Z8639 Personal history of other endocrine, nutritional and metabolic disease: Secondary | ICD-10-CM | POA: Diagnosis not present

## 2015-08-11 DIAGNOSIS — Z862 Personal history of diseases of the blood and blood-forming organs and certain disorders involving the immune mechanism: Secondary | ICD-10-CM | POA: Diagnosis not present

## 2015-08-11 DIAGNOSIS — Y9389 Activity, other specified: Secondary | ICD-10-CM | POA: Diagnosis not present

## 2015-08-11 DIAGNOSIS — M25512 Pain in left shoulder: Secondary | ICD-10-CM | POA: Insufficient documentation

## 2015-08-11 DIAGNOSIS — Y998 Other external cause status: Secondary | ICD-10-CM | POA: Insufficient documentation

## 2015-08-11 DIAGNOSIS — R51 Headache: Secondary | ICD-10-CM | POA: Diagnosis not present

## 2015-08-11 DIAGNOSIS — S4992XA Unspecified injury of left shoulder and upper arm, initial encounter: Secondary | ICD-10-CM | POA: Diagnosis present

## 2015-08-11 DIAGNOSIS — Z8619 Personal history of other infectious and parasitic diseases: Secondary | ICD-10-CM | POA: Insufficient documentation

## 2015-08-11 HISTORY — DX: Other chronic pain: G89.29

## 2015-08-11 HISTORY — DX: Unspecified convulsions: R56.9

## 2015-08-11 MED ORDER — METHOCARBAMOL 500 MG PO TABS: 1000.0000 mg | ORAL_TABLET | Freq: Four times a day (QID) | ORAL | Status: AC | PRN

## 2015-08-11 MED ORDER — HYDROCODONE-ACETAMINOPHEN 5-325 MG PO TABS
ORAL_TABLET | ORAL | Status: DC
Start: 2015-08-11 — End: 2015-09-23

## 2015-08-11 MED ORDER — HYDROCODONE-ACETAMINOPHEN 5-325 MG PO TABS
1.0000 | ORAL_TABLET | Freq: Once | ORAL | Status: AC
  Administered 2015-08-11: 1 via ORAL
  Filled 2015-08-11: qty 1
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
## 2015-08-11 NOTE — Discharge Instructions (Signed)
°Emergency Department Resource Guide °1) Find a Doctor and Pay Out of Pocket °Although you won't have to find out who is covered by your insurance plan, it is a good idea to ask around and get recommendations. You will then need to call the office and see if the doctor you have chosen will accept you as a new patient and what types of options they offer for patients who are self-pay. Some doctors offer discounts or will set up payment plans for their patients who do not have insurance, but you will need to ask so you aren't surprised when you get to your appointment. ° °2) Contact Your Local Health Department °Not all health departments have doctors that can see patients for sick visits, but many do, so it is worth a call to see if yours does. If you don't know where your local health department is, you can check in your phone book. The CDC also has a tool to help you locate your state's health department, and many state websites also have listings of all of their local health departments. ° °3) Find a Walk-in Clinic °If your illness is not likely to be very severe or complicated, you may want to try a walk in clinic. These are popping up all over the country in pharmacies, drugstores, and shopping centers. They're usually staffed by nurse practitioners or physician assistants that have been trained to treat common illnesses and complaints. They're usually fairly quick and inexpensive. However, if you have serious medical issues or chronic medical problems, these are probably not your best option. ° °No Primary Care Doctor: °- Call [facility] Connect at  832-8000 - they can help you locate a primary care doctor that  accepts your insurance, provides certain services, etc. °- Physician Referral Service- 1-800-533-3463 ° °Chronic Pain Problems: °Organization         Address  Phone   Notes  °[facility]  ([phone]) [phone] Patients need to be referred by their primary care doctor.  ° °Medication  Assistance: °Organization         Address  Phone   Notes  °[place] [facility] [address] °[place], [place] [address] (336) 641-8030 --Must be a resident of [place] °-- Must have NO insurance coverage whatsoever (no Medicaid/ Medicare, etc.) °-- The pt. MUST have a primary care doctor that directs their care regularly and follows them in the community °  °[facility]  (866) 331-1348   °[employer]  (888) 892-1162   ° °Agencies that provide inexpensive medical care: °Organization         Address  Phone   Notes  °[facility]  (336) 832-8035   °[facility]    (336) 832-7272   °[facility] [address] °[place], [place] [address] (336) 832-4777   °[facility] [address], °[place] (336) 271-4999   °[facility]    (336) 373-0678   °[facility]    (336) 272-1050   °[facility] ° [address], [place] Phone:  (336) 832-4444, Fax:  (336) 832-4440 Hours of Operation:  9 am - 6 pm, M-F.  Also accepts Medicaid/Medicare and self-pay.  °[facility] ° [address], [place] Phone: (336) [phone], Fax: (336) [phone]. Hours of Operation:  8:30 am - 5:30 pm, M-F.  Also accepts Medicaid and self-pay.  °[facility] [place] [address], [place] Phone: (336) 878-6027   °[facility] [address], [place], [place] (336)723-1848, Ext. 123 Mondays & Thursdays: 7-9 AM.  First 15 patients are seen on a first come, first serve basis. °  ° °Medicaid-accepting [place] Providers: ° °Organization         Address  Phone   Notes  °[facility] [address], [place] (336) 641-2100 Also accepts self-pay patients.  °[facility] [address], [place] ° (336) [phone]   °[facility] [address], [place]  (336) 288-8857   °[facility] [address] [place] [address], [place] (336) 299-7000   °Veita Bland [address], [place]  ° (336) 373-1557 Only accepts [place] Access Medicaid patients after they have their name applied to their card.  ° °Self-Pay (no insurance) in [place]: ° °Organization         Address  Phone   Notes  °Sickle Cell Patients, [facility] [address], [place] (336) 832-1970   °[facility] [address], [place] (336) 832-4400   °[facility] ° [address], [place] (336) 992-4800   °[facility]/Dr. Osei-Bonsu ° [address] [place] [address], [place] or [address], [place] (336) [phone] Phone number for both [place] and [place] locations is the same.  °[facility] [address], [place] (336) 299-0000   °[facility] [address] [place] [address], [place] or [address] (336) 852-7530 °(336) 878-2260   °[facility] [address], [place] (336) 350-1642, phone; (336) 294-5005, fax Sees patients 1st and 3rd Saturday of every month.  Must not qualify for public or private insurance (i.e. Medicaid, Medicare, [place] Health Choice, Veterans' Benefits) • Household income should be no more than 200% of the poverty level •The clinic cannot treat you if you are pregnant or think you are pregnant • Sexually transmitted diseases are not treated at the clinic.  ° ° °Dental Care: °Organization         Address  Phone  Notes  °[place] [facility] [address], [place] (336) 641-6152 Accepts children up to age 21 who are enrolled in Medicaid or [place] Health Choice; pregnant women with a Medicaid card; and children who have applied for Medicaid or [place] Health Choice, but were declined, whose parents can pay a reduced fee at time of service.  °[place]  [facility] [place]  [address], [place] (336) 641-7733 Accepts children up to age 21 who are enrolled in Medicaid or [place] Health Choice; pregnant women with a Medicaid card; and children who have applied for Medicaid or [place] Health Choice, but were declined, whose parents can pay a reduced fee at time of service.  °[facility] ° [address], [place] (336) 641-4533 Patients are seen by appointment only. Walk-ins are not accepted. [facility] will see patients 18 years of age and older. °Monday - Tuesday (8am-5pm) °Most Wednesdays (8:30-5pm) °$30 per visit, cash only  °[facility] ° [address], [place] (336) 641-4533 Patients are seen by appointment only. Walk-ins are not accepted. [facility] will see patients 18 years of age and older. °One   Wednesday Evening (Monthly: Volunteer Based).  $30 per visit, cash only  °[facility]  (919) [phone] for adults; Children under age 4, call Graduate Pediatric Dentistry at ([phone]) [phone]. Children aged 4-14, please call ([phone]) [phone] to request a pediatric application. ° Dental services are provided in all areas of dental care including fillings, crowns and bridges, complete and partial dentures, implants, gum treatment, root canals, and extractions. Preventive care is also provided. Treatment is provided to both adults and children. °Patients are selected via a lottery and there is often a waiting list. °  °[facility] [address], °[place] ° (336) 763-8833 [email or web address] °  °[facility] [address], [place], [place] (336)723-1848, Ext. 123 Second and Fourth Thursday of each month, opens at 6:30 AM; Clinic ends at 9 AM.  Patients are seen on a first-come first-served basis, and a limited number are seen during each clinic.  ° °[facility] ° [address], [place], [place] (336) [phone]    Eligibility Requirements °You must have lived in [place], [place], or [place] counties for at least the last three months. °  You cannot be eligible for state or federal sponsored healthcare insurance, including Veterans Administration, Medicaid, or Medicare. °  You generally cannot be eligible for healthcare insurance through your employer.  °  How to apply: °Eligibility screenings are held every Tuesday and Wednesday afternoon from 1:00 pm until 4:00 pm. You do not need an appointment for the interview!  °[facility] [address], [place], [place] 336-631-2330   °[facility]  336-342-8273   °[facility]  336-703-3100   °[facility]  336-570-6415   ° °Behavioral Health Resources in the Community: °Intensive Outpatient Programs °Organization         Address  Phone  Notes  °[facility] [address], [place], [place] 336-878-6098   °[facility] [address], [place], [place] 336-832-9800   °[facility] [address], [place], [place] ° 336-882-2125   °[facility] [address],  °[place], [place] 1-800-853-5163 or 336-641-4981   °Substance Abuse Resources °Organization         Address  Phone  Notes  °[facility]  336-882-2125   °[facility]  336-784-9470   °[facility]  336-285-9073   °[facility]  336-845-3988   °Residential & Outpatient Substance Abuse Program  1-800-659-3381   °Psychological Services °Organization         Address  Phone  Notes  °[facility]  336- 832-9600   °[facility]  336- 378-7881   °[facility] [address], [place] 1-800-853-5163 or 336-641-4981   ° °Mobile Crisis Teams °Organization         Address  Phone  Notes  °[facility], Mobile Crisis Care Unit  1-877-626-1772   °Assertive °[facility] ° [address]  [place], [place] 336-834-9664   °Sharon DeEsch [address] °[place] 336-554-5454   ° °Self-Help/Support Groups °Organization         Address  Phone             Notes  °[facility]  - variety of support groups  336- 373-1402 Call for more information  °Narcotics Anonymous (NA), [facility] [address], °[place]  2 meetings at this location  ° °  Residential Treatment Programs Organization         Address  Phone  Notes  [facility] [address],    [place]  0-865-784-6962   [facility]  [address] [place] [address], [place], [place] 324-401-0272   [facility] [address] [place], [place] 536-644-0347 Admissions: 8am-3pm M-F  [facility] [address],    [place], [place] 425-956-3875   [facility] [address] [place], [place], [place] 643-329-5188   [facility] [address]  [place], [place] 416-606-3016   [facility] - Intensive Outpatient [address], [place], [place] 010-932-3557   [facility] ([facility] [address] [place]  [place], [place] 3-220-254-2706 or 640-045-4712   [facility] ([facility] [address], [place], [place] 761-607-3710 Accepts Medicaid  [facility] [address]  [place] 6-269-485-4627 Substance Abuse/Addiction Treatment   [facility]         Address  Phone  Notes  [facility]  [phone]   Angie Fava, PhD [address] [place], [place]   (435)022-9988 or 567-259-1856   [facility]   [address] [place], [place] 4160517702   [facility] [address] [place], [place] [phone] Insurance/Medicaid/sponsorship through [facility] [address]                                    [place], [place] ([phone] Therapy/tele-psych/case    [facility] [address], [place] (772)[phone]    Dr. Lolly Mustache  [phone]   [facility]  [facility] 1) [address] [place] 2) [address], [place] 3)  [address], [place] [phone]  5012372843   [facility] Child Abuse Hotline 8152659753 or 3641848434 (After Hours)      Take the prescriptions as directed.  Apply moist heat or ice to the area(s) of discomfort, for 15 minutes at a time, several times per day for the next few days.  Do not fall asleep on a heating or ice pack. Wear the sling for comfort for the next 2 to 3 days, then remove and slowly return to your usual activities. Call your regular medical doctor and the Orthopedic doctor on Monday to schedule a follow up appointment in the next 2 to 3 days.  Return to the Emergency Department immediately if worsening.

## 2015-08-20 ENCOUNTER — Emergency Department (HOSPITAL_COMMUNITY)
Admission: EM | Admit: 2015-08-20 | Discharge: 2015-08-20 | Disposition: A | Discharge status: Home / Self Care | Payer: Medicaid Other | Attending: Emergency Medicine | Admitting: Emergency Medicine

## 2015-08-20 ENCOUNTER — Emergency Department (HOSPITAL_COMMUNITY): Payer: Medicaid Other

## 2015-08-20 ENCOUNTER — Encounter (HOSPITAL_COMMUNITY): Payer: Self-pay | Admitting: *Deleted

## 2015-08-20 DIAGNOSIS — W1831XA Fall on same level due to stepping on an object, initial encounter: Secondary | ICD-10-CM | POA: Diagnosis not present

## 2015-08-20 DIAGNOSIS — Y9289 Other specified places as the place of occurrence of the external cause: Secondary | ICD-10-CM | POA: Diagnosis not present

## 2015-08-20 DIAGNOSIS — Z79899 Other long term (current) drug therapy: Secondary | ICD-10-CM | POA: Diagnosis not present

## 2015-08-20 DIAGNOSIS — Z862 Personal history of diseases of the blood and blood-forming organs and certain disorders involving the immune mechanism: Secondary | ICD-10-CM | POA: Insufficient documentation

## 2015-08-20 DIAGNOSIS — S93401A Sprain of unspecified ligament of right ankle, initial encounter: Secondary | ICD-10-CM | POA: Insufficient documentation

## 2015-08-20 DIAGNOSIS — S8391XA Sprain of unspecified site of right knee, initial encounter: Secondary | ICD-10-CM | POA: Insufficient documentation

## 2015-08-20 DIAGNOSIS — Y999 Unspecified external cause status: Secondary | ICD-10-CM | POA: Insufficient documentation

## 2015-08-20 DIAGNOSIS — I252 Old myocardial infarction: Secondary | ICD-10-CM | POA: Insufficient documentation

## 2015-08-20 DIAGNOSIS — Z7982 Long term (current) use of aspirin: Secondary | ICD-10-CM | POA: Insufficient documentation

## 2015-08-20 DIAGNOSIS — S8991XA Unspecified injury of right lower leg, initial encounter: Secondary | ICD-10-CM | POA: Diagnosis present

## 2015-08-20 DIAGNOSIS — Y93H2 Activity, gardening and landscaping: Secondary | ICD-10-CM | POA: Diagnosis not present

## 2015-08-20 DIAGNOSIS — F17210 Nicotine dependence, cigarettes, uncomplicated: Secondary | ICD-10-CM | POA: Insufficient documentation

## 2015-08-20 MED ORDER — TRAMADOL HCL 50 MG PO TABS: 50.0000 mg | ORAL_TABLET | Freq: Four times a day (QID) | ORAL | Status: DC | PRN

## 2015-08-20 MED ORDER — OXYCODONE-ACETAMINOPHEN 5-325 MG PO TABS
1.0000 | ORAL_TABLET | Freq: Once | ORAL | Status: AC
  Administered 2015-08-20: 1 via ORAL
  Filled 2015-08-20: qty 1

## 2015-08-20 MED ORDER — HYDROCODONE-ACETAMINOPHEN 5-325 MG PO TABS
1.0000 | ORAL_TABLET | ORAL | Status: AC
  Administered 2015-08-20: 1 via ORAL
  Filled 2015-08-20: qty 1
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
## 2015-08-20 NOTE — Discharge Instructions (Signed)
Ankle Sprain  An ankle sprain is an injury to the strong, fibrous tissues (ligaments) that hold the bones of your ankle joint together.   CAUSES  An ankle sprain is usually caused by a fall or by twisting your ankle. Ankle sprains most commonly occur when you step on the outer edge of your foot, and your ankle turns inward. People who participate in sports are more prone to these types of injuries.   SYMPTOMS    Pain in your ankle. The pain may be present at rest or only when you are trying to stand or walk.   Swelling.   Bruising. Bruising may develop immediately or within 1 to 2 days after your injury.   Difficulty standing or walking, particularly when turning corners or changing directions.  DIAGNOSIS   Your caregiver will ask you details about your injury and perform a physical exam of your ankle to determine if you have an ankle sprain. During the physical exam, your caregiver will press on and apply pressure to specific areas of your foot and ankle. Your caregiver will try to move your ankle in certain ways. An X-ray exam may be done to be sure a bone was not broken or a ligament did not separate from one of the bones in your ankle (avulsion fracture).   TREATMENT   Certain types of braces can help stabilize your ankle. Your caregiver can make a recommendation for this. Your caregiver may recommend the use of medicine for pain. If your sprain is severe, your caregiver may refer you to a surgeon who helps to restore function to parts of your skeletal system (orthopedist) or a physical therapist.  HOME CARE INSTRUCTIONS    Apply ice to your injury for 1-2 days or as directed by your caregiver. Applying ice helps to reduce inflammation and pain.    Put ice in a plastic bag.    Place a towel between your skin and the bag.    Leave the ice on for 15-20 minutes at a time, every 2 hours while you are awake.   Only take over-the-counter or prescription medicines for pain, discomfort, or fever as directed by  your caregiver.   Elevate your injured ankle above the level of your heart as much as possible for 2-3 days.   If your caregiver recommends crutches, use them as instructed. Gradually put weight on the affected ankle. Continue to use crutches or a cane until you can walk without feeling pain in your ankle.   If you have a plaster splint, wear the splint as directed by your caregiver. Do not rest it on anything harder than a pillow for the first 24 hours. Do not put weight on it. Do not get it wet. You may take it off to take a shower or bath.   You may have been given an elastic bandage to wear around your ankle to provide support. If the elastic bandage is too tight (you have numbness or tingling in your foot or your foot becomes cold and blue), adjust the bandage to make it comfortable.   If you have an air splint, you may blow more air into it or let air out to make it more comfortable. You may take your splint off at night and before taking a shower or bath. Wiggle your toes in the splint several times per day to decrease swelling.  SEEK MEDICAL CARE IF:    You have rapidly increasing bruising or swelling.   Your toes feel   extremely cold or you lose feeling in your foot.   Your pain is not relieved with medicine.  SEEK IMMEDIATE MEDICAL CARE IF:   Your toes are numb or blue.   You have severe pain that is increasing.  MAKE SURE YOU:    Understand these instructions.   Will watch your condition.   Will get help right away if you are not doing well or get worse.     This information is not intended to replace advice given to you by your health care provider. Make sure you discuss any questions you have with your health care provider.     Document Released: 06/02/2005 Document Revised: 06/23/2014 Document Reviewed: 06/14/2011  Elsevier Interactive Patient Education 2016 [employer]

## 2015-08-20 NOTE — ED Notes (Signed)
Pt c/o swelling and pain to right LE since a lawnmower fell on it two days ago per pt.  Good pedal pulses

## 2015-08-22 ENCOUNTER — Emergency Department (HOSPITAL_COMMUNITY)
Admission: EM | Admit: 2015-08-22 | Discharge: 2015-08-23 | Disposition: A | Discharge status: Home / Self Care | Payer: Medicaid Other | Attending: Emergency Medicine | Admitting: Emergency Medicine

## 2015-08-22 ENCOUNTER — Encounter (HOSPITAL_COMMUNITY): Payer: Self-pay | Admitting: Emergency Medicine

## 2015-08-22 DIAGNOSIS — M7989 Other specified soft tissue disorders: Secondary | ICD-10-CM

## 2015-08-22 DIAGNOSIS — I252 Old myocardial infarction: Secondary | ICD-10-CM | POA: Insufficient documentation

## 2015-08-22 DIAGNOSIS — F17210 Nicotine dependence, cigarettes, uncomplicated: Secondary | ICD-10-CM | POA: Insufficient documentation

## 2015-08-22 DIAGNOSIS — M79641 Pain in right hand: Secondary | ICD-10-CM

## 2015-08-22 DIAGNOSIS — R2231 Localized swelling, mass and lump, right upper limb: Secondary | ICD-10-CM | POA: Diagnosis not present

## 2015-08-22 DIAGNOSIS — D571 Sickle-cell disease without crisis: Secondary | ICD-10-CM | POA: Insufficient documentation

## 2015-08-22 DIAGNOSIS — G40909 Epilepsy, unspecified, not intractable, without status epilepticus: Secondary | ICD-10-CM | POA: Diagnosis not present
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
## 2015-08-22 NOTE — ED Notes (Signed)
Pt c/o rt hand pain and swelling

## 2015-08-23 LAB — BASIC METABOLIC PANEL
Anion gap: 5 (ref 5–15)
BUN: 14 mg/dL (ref 6–20)
CHLORIDE: 107 mmol/L (ref 101–111)
CO2: 27 mmol/L (ref 22–32)
CREATININE: 0.82 mg/dL (ref 0.61–1.24)
Calcium: 8.4 mg/dL — ABNORMAL LOW (ref 8.9–10.3)
GFR calc non Af Amer: >60 mL/min (ref >60)
Glucose, Bld: 89 mg/dL (ref 65–99)
POTASSIUM: 3.9 mmol/L (ref 3.5–5.1)
SODIUM: 139 mmol/L (ref 135–145)

## 2015-08-23 LAB — CBC WITH DIFFERENTIAL/PLATELET
Basophils Absolute: 0 10*3/uL (ref 0.0–0.1)
Basophils Relative: 1 %
EOS ABS: 0.3 10*3/uL (ref 0.0–0.7)
EOS PCT: 9 %
HCT: 37.7 % — ABNORMAL LOW (ref 39.0–52.0)
Hemoglobin: 11.8 g/dL — ABNORMAL LOW (ref 13.0–17.0)
LYMPHS ABS: 1.8 10*3/uL (ref 0.7–4.0)
Lymphocytes Relative: 47 %
MCH: 23.3 pg — AB (ref 26.0–34.0)
MCHC: 31.3 g/dL (ref 30.0–36.0)
MCV: 74.4 fL — ABNORMAL LOW (ref 78.0–100.0)
MONOS PCT: 8 %
Monocytes Absolute: 0.3 10*3/uL (ref 0.1–1.0)
Neutro Abs: 1.3 10*3/uL — ABNORMAL LOW (ref 1.7–7.7)
Neutrophils Relative %: 35 %
PLATELETS: 154 10*3/uL (ref 150–400)
RBC: 5.07 MIL/uL (ref 4.22–5.81)
RDW: 17.7 % — ABNORMAL HIGH (ref 11.5–15.5)
WBC: 3.8 10*3/uL — ABNORMAL LOW (ref 4.0–10.5)

## 2015-08-23 LAB — SEDIMENTATION RATE: SED RATE: 5 mm/h (ref 0–16)

## 2015-08-23 MED ORDER — HYDROMORPHONE HCL 1 MG/ML IJ SOLN
1.0000 mg | Freq: Once | INTRAMUSCULAR | Status: AC
  Administered 2015-08-23: 1 mg via INTRAVENOUS
  Filled 2015-08-23: qty 1

## 2015-08-23 MED ORDER — OXYCODONE-ACETAMINOPHEN 5-325 MG PO TABS
1.0000 | ORAL_TABLET | Freq: Once | ORAL | Status: AC
  Administered 2015-08-23: 1 via ORAL
  Filled 2015-08-23: qty 1

## 2015-08-23 MED ORDER — HYDROMORPHONE HCL 2 MG/ML IJ SOLN
INTRAMUSCULAR | Status: AC
  Filled 2015-08-23: qty 1

## 2015-08-23 MED ORDER — PREDNISONE 50 MG PO TABS: 50.0000 mg | ORAL_TABLET | Freq: Every day | ORAL | Status: DC

## 2015-08-23 MED ORDER — HYDROMORPHONE HCL 2 MG/ML IJ SOLN
2.0000 mg | Freq: Once | INTRAMUSCULAR | Status: DC
  Filled 2015-08-23: qty 1

## 2015-08-23 MED ORDER — OXYCODONE-ACETAMINOPHEN 5-325 MG PO TABS: 1.0000 | ORAL_TABLET | ORAL | Status: DC | PRN

## 2015-08-23 MED ORDER — PREDNISONE 50 MG PO TABS
60.0000 mg | ORAL_TABLET | Freq: Once | ORAL | Status: AC
  Administered 2015-08-23: 60 mg via ORAL
  Filled 2015-08-23: qty 1

## 2015-08-23 NOTE — ED Notes (Signed)
Pt given crackers, peanut butter and gingerale 

## 2015-08-23 NOTE — Discharge Instructions (Signed)
The cause for the pain and swelling in your right hand is not clear, but there is no sign of infection today. If it is not responding to treatment, follow-up with the hand specialist.  Prednisone tablets What is this medicine? PREDNISONE (PRED ni sone) is a corticosteroid. It is commonly used to treat inflammation of the skin, joints, lungs, and other organs. Common conditions treated include asthma, allergies, and arthritis. It is also used for other conditions, such as blood disorders and diseases of the adrenal glands. This medicine may be used for other purposes; ask your health care provider or pharmacist if you have questions. What should I tell my health care provider before I take this medicine? They need to know if you have any of these conditions: -Cushing's syndrome -diabetes -glaucoma -heart disease -high blood pressure -infection (especially a virus infection such as chickenpox, cold sores, or herpes) -kidney disease -liver disease -mental illness -myasthenia gravis -osteoporosis -seizures -stomach or intestine problems -thyroid disease -an unusual or allergic reaction to lactose, prednisone, other medicines, foods, dyes, or preservatives -pregnant or trying to get pregnant -breast-feeding How should I use this medicine? Take this medicine by mouth with a glass of water. Follow the directions on the prescription label. Take this medicine with food. If you are taking this medicine once a day, take it in the morning. Do not take more medicine than you are told to take. Do not suddenly stop taking your medicine because you may develop a severe reaction. Your doctor will tell you how much medicine to take. If your doctor wants you to stop the medicine, the dose may be slowly lowered over time to avoid any side effects. Talk to your pediatrician regarding the use of this medicine in children. Special care may be needed. Overdosage: If you think you have taken too much of this  medicine contact a poison control center or emergency room at once. NOTE: This medicine is only for you. Do not share this medicine with others. What if I miss a dose? If you miss a dose, take it as soon as you can. If it is almost time for your next dose, talk to your doctor or health care professional. You may need to miss a dose or take an extra dose. Do not take double or extra doses without advice. What may interact with this medicine? Do not take this medicine with any of the following medications: -metyrapone -mifepristone This medicine may also interact with the following medications: -aminoglutethimide -amphotericin B -aspirin and aspirin-like medicines -barbiturates -certain medicines for diabetes, like glipizide or glyburide -cholestyramine -cholinesterase inhibitors -cyclosporine -digoxin -diuretics -ephedrine -male hormones, like estrogens and birth control pills -isoniazid -ketoconazole -NSAIDS, medicines for pain and inflammation, like ibuprofen or naproxen -phenytoin -rifampin -toxoids -vaccines -warfarin This list may not describe all possible interactions. Give your health care provider a list of all the medicines, herbs, non-prescription drugs, or dietary supplements you use. Also tell them if you smoke, drink alcohol, or use illegal drugs. Some items may interact with your medicine. What should I watch for while using this medicine? Visit your doctor or health care professional for regular checks on your progress. If you are taking this medicine over a prolonged period, carry an identification card with your name and address, the type and dose of your medicine, and your doctor's name and address. This medicine may increase your risk of getting an infection. Tell your doctor or health care professional if you are around anyone with measles or chickenpox,  or if you develop sores or blisters that do not heal properly. If you are going to have surgery, tell your  doctor or health care professional that you have taken this medicine within the last twelve months. Ask your doctor or health care professional about your diet. You may need to lower the amount of salt you eat. This medicine may affect blood sugar levels. If you have diabetes, check with your doctor or health care professional before you change your diet or the dose of your diabetic medicine. What side effects may I notice from receiving this medicine? Side effects that you should report to your doctor or health care professional as soon as possible: -allergic reactions like skin rash, itching or hives, swelling of the face, lips, or tongue -changes in emotions or moods -changes in vision -depressed mood -eye pain -fever or chills, cough, sore throat, pain or difficulty passing urine -increased thirst -swelling of ankles, feet Side effects that usually do not require medical attention (report to your doctor or health care professional if they continue or are bothersome): -confusion, excitement, restlessness -headache -nausea, vomiting -skin problems, acne, thin and shiny skin -trouble sleeping -weight gain This list may not describe all possible side effects. Call your doctor for medical advice about side effects. You may report side effects to FDA at 1-800-FDA-1088. Where should I keep my medicine? Keep out of the reach of children. Store at room temperature between 15 and 30 degrees C (59 and 86 degrees F). Protect from light. Keep container tightly closed. Throw away any unused medicine after the expiration date. NOTE: This sheet is a summary. It may not cover all possible information. If you have questions about this medicine, talk to your doctor, pharmacist, or health care provider.    2016, [employer]vier/[employer] Standard. (2011-01-16 10:57:14)  Acetaminophen; Oxycodone tablets What is this medicine? ACETAMINOPHEN; OXYCODONE (a set a MEE noe fen; ox i KOE done) is a pain reliever. It is  used to treat moderate to severe pain. This medicine may be used for other purposes; ask your health care provider or pharmacist if you have questions. What should I tell my health care provider before I take this medicine? They need to know if you have any of these conditions: -brain tumor -Crohn's disease, inflammatory bowel disease, or ulcerative colitis -drug abuse or addiction -head injury -heart or circulation problems -if you often drink alcohol -kidney disease or problems going to the bathroom -liver disease -lung disease, asthma, or breathing problems -an unusual or allergic reaction to acetaminophen, oxycodone, other opioid analgesics, other medicines, foods, dyes, or preservatives -pregnant or trying to get pregnant -breast-feeding How should I use this medicine? Take this medicine by mouth with a full glass of water. Follow the directions on the prescription label. You can take it with or without food. If it upsets your stomach, take it with food. Take your medicine at regular intervals. Do not take it more often than directed. Talk to your pediatrician regarding the use of this medicine in children. Special care may be needed. Patients over 15 years old may have a stronger reaction and need a smaller dose. Overdosage: If you think you have taken too much of this medicine contact a poison control center or emergency room at once. NOTE: This medicine is only for you. Do not share this medicine with others. What if I miss a dose? If you miss a dose, take it as soon as you can. If it is almost time for your next  dose, take only that dose. Do not take double or extra doses. What may interact with this medicine? -alcohol -antihistamines -barbiturates like amobarbital, butalbital, butabarbital, methohexital, pentobarbital, phenobarbital, thiopental, and secobarbital -benztropine -drugs for bladder problems like solifenacin, trospium, oxybutynin, tolterodine, hyoscyamine, and  methscopolamine -drugs for breathing problems like ipratropium and tiotropium -drugs for certain stomach or intestine problems like propantheline, homatropine methylbromide, glycopyrrolate, atropine, belladonna, and dicyclomine -general anesthetics like etomidate, ketamine, nitrous oxide, propofol, desflurane, enflurane, halothane, isoflurane, and sevoflurane -medicines for depression, anxiety, or psychotic disturbances -medicines for sleep -muscle relaxants -naltrexone -narcotic medicines (opiates) for pain -phenothiazines like perphenazine, thioridazine, chlorpromazine, mesoridazine, fluphenazine, prochlorperazine, promazine, and trifluoperazine -scopolamine -tramadol -trihexyphenidyl This list may not describe all possible interactions. Give your health care provider a list of all the medicines, herbs, non-prescription drugs, or dietary supplements you use. Also tell them if you smoke, drink alcohol, or use illegal drugs. Some items may interact with your medicine. What should I watch for while using this medicine? Tell your doctor or health care professional if your pain does not go away, if it gets worse, or if you have new or a different type of pain. You may develop tolerance to the medicine. Tolerance means that you will need a higher dose of the medication for pain relief. Tolerance is normal and is expected if you take this medicine for a long time. Do not suddenly stop taking your medicine because you may develop a severe reaction. Your body becomes used to the medicine. This does NOT mean you are addicted. Addiction is a behavior related to getting and using a drug for a non-medical reason. If you have pain, you have a medical reason to take pain medicine. Your doctor will tell you how much medicine to take. If your doctor wants you to stop the medicine, the dose will be slowly lowered over time to avoid any side effects. You may get drowsy or dizzy. Do not drive, use machinery, or do  anything that needs mental alertness until you know how this medicine affects you. Do not stand or sit up quickly, especially if you are an older patient. This reduces the risk of dizzy or fainting spells. Alcohol may interfere with the effect of this medicine. Avoid alcoholic drinks. There are different types of narcotic medicines (opiates) for pain. If you take more than one type at the same time, you may have more side effects. Give your health care provider a list of all medicines you use. Your doctor will tell you how much medicine to take. Do not take more medicine than directed. Call emergency for help if you have problems breathing. The medicine will cause constipation. Try to have a bowel movement at least every 2 to 3 days. If you do not have a bowel movement for 3 days, call your doctor or health care professional. Do not take Tylenol (acetaminophen) or medicines that have acetaminophen with this medicine. Too much acetaminophen can be very dangerous. Many nonprescription medicines contain acetaminophen. Always read the labels carefully to avoid taking more acetaminophen. What side effects may I notice from receiving this medicine? Side effects that you should report to your doctor or health care professional as soon as possible: -allergic reactions like skin rash, itching or hives, swelling of the face, lips, or tongue -breathing difficulties, wheezing -confusion -light headedness or fainting spells -severe stomach pain -unusually weak or tired -yellowing of the skin or the whites of the eyes Side effects that usually do not require medical attention (report  to your doctor or health care professional if they continue or are bothersome): -dizziness -drowsiness -nausea -vomiting This list may not describe all possible side effects. Call your doctor for medical advice about side effects. You may report side effects to FDA at 1-800-FDA-1088. Where should I keep my medicine? Keep out of  the reach of children. This medicine can be abused. Keep your medicine in a safe place to protect it from theft. Do not share this medicine with anyone. Selling or giving away this medicine is dangerous and against the law. This medicine may cause accidental overdose and death if it taken by other adults, children, or pets. Mix any unused medicine with a substance like cat litter or coffee grounds. Then throw the medicine away in a sealed container like a sealed bag or a coffee can with a lid. Do not use the medicine after the expiration date. Store at room temperature between 20 and 25 degrees C (68 and 77 degrees F). NOTE: This sheet is a summary. It may not cover all possible information. If you have questions about this medicine, talk to your doctor, pharmacist, or health care provider.    2016, [employer] Standard. (2014-05-03 15:18:46)

## 2015-09-07 ENCOUNTER — Ambulatory Visit (HOSPITAL_COMMUNITY)
Admission: RE | Admit: 2015-09-07 | Discharge: 2015-09-07 | Disposition: A | Discharge status: Home / Self Care | Payer: Medicaid Other | Source: Ambulatory Visit | Attending: Internal Medicine | Admitting: Internal Medicine

## 2015-09-07 ENCOUNTER — Other Ambulatory Visit (HOSPITAL_COMMUNITY): Payer: Self-pay | Admitting: Internal Medicine

## 2015-09-07 DIAGNOSIS — M25541 Pain in joints of right hand: Secondary | ICD-10-CM | POA: Insufficient documentation

## 2015-09-07 DIAGNOSIS — M79641 Pain in right hand: Secondary | ICD-10-CM

## 2015-09-07 DIAGNOSIS — M19041 Primary osteoarthritis, right hand: Secondary | ICD-10-CM | POA: Diagnosis not present

## 2015-09-23 ENCOUNTER — Emergency Department (HOSPITAL_COMMUNITY): Payer: Medicaid Other

## 2015-09-23 ENCOUNTER — Emergency Department (HOSPITAL_COMMUNITY)
Admission: EM | Admit: 2015-09-23 | Discharge: 2015-09-23 | Disposition: A | Discharge status: Home / Self Care | Payer: Medicaid Other | Attending: Emergency Medicine | Admitting: Emergency Medicine

## 2015-09-23 ENCOUNTER — Encounter (HOSPITAL_COMMUNITY): Payer: Self-pay | Admitting: Emergency Medicine

## 2015-09-23 DIAGNOSIS — Z7982 Long term (current) use of aspirin: Secondary | ICD-10-CM | POA: Diagnosis not present

## 2015-09-23 DIAGNOSIS — Z79899 Other long term (current) drug therapy: Secondary | ICD-10-CM | POA: Diagnosis not present

## 2015-09-23 DIAGNOSIS — F17210 Nicotine dependence, cigarettes, uncomplicated: Secondary | ICD-10-CM | POA: Diagnosis not present

## 2015-09-23 DIAGNOSIS — M791 Myalgia: Secondary | ICD-10-CM | POA: Insufficient documentation

## 2015-09-23 DIAGNOSIS — R600 Localized edema: Secondary | ICD-10-CM | POA: Insufficient documentation

## 2015-09-23 DIAGNOSIS — R079 Chest pain, unspecified: Secondary | ICD-10-CM | POA: Diagnosis not present

## 2015-09-23 DIAGNOSIS — D5700 Hb-SS disease with crisis, unspecified: Secondary | ICD-10-CM | POA: Diagnosis present

## 2015-09-23 DIAGNOSIS — R52 Pain, unspecified: Secondary | ICD-10-CM

## 2015-09-23 DIAGNOSIS — R609 Edema, unspecified: Secondary | ICD-10-CM

## 2015-09-23 LAB — BASIC METABOLIC PANEL
ANION GAP: 6 (ref 5–15)
BUN: 11 mg/dL (ref 6–20)
CO2: 21 mmol/L — ABNORMAL LOW (ref 22–32)
Calcium: 8.5 mg/dL — ABNORMAL LOW (ref 8.9–10.3)
Chloride: 111 mmol/L (ref 101–111)
Creatinine, Ser: 0.93 mg/dL (ref 0.61–1.24)
Glucose, Bld: 125 mg/dL — ABNORMAL HIGH (ref 65–99)
POTASSIUM: 4.1 mmol/L (ref 3.5–5.1)
Sodium: 138 mmol/L (ref 135–145)

## 2015-09-23 LAB — CBC WITH DIFFERENTIAL/PLATELET
BASOS ABS: 0 10*3/uL (ref 0.0–0.1)
BASOS PCT: 1 %
EOS ABS: 0.3 10*3/uL (ref 0.0–0.7)
Eosinophils Relative: 9 %
HEMATOCRIT: 41.7 % (ref 39.0–52.0)
HEMOGLOBIN: 13.8 g/dL (ref 13.0–17.0)
Lymphocytes Relative: 35 %
Lymphs Abs: 1.4 10*3/uL (ref 0.7–4.0)
MCH: 24.2 pg — ABNORMAL LOW (ref 26.0–34.0)
MCHC: 33.1 g/dL (ref 30.0–36.0)
MCV: 73.2 fL — ABNORMAL LOW (ref 78.0–100.0)
MONO ABS: 0.4 10*3/uL (ref 0.1–1.0)
MONOS PCT: 9 %
NEUTROS ABS: 1.9 10*3/uL (ref 1.7–7.7)
NEUTROS PCT: 48 %
Platelets: 177 10*3/uL (ref 150–400)
RBC: 5.7 MIL/uL (ref 4.22–5.81)
RDW: 18 % — AB (ref 11.5–15.5)
WBC: 3.9 10*3/uL — ABNORMAL LOW (ref 4.0–10.5)

## 2015-09-23 LAB — URINALYSIS, ROUTINE W REFLEX MICROSCOPIC
BILIRUBIN URINE: NEGATIVE
Glucose, UA: NEGATIVE mg/dL
HGB URINE DIPSTICK: NEGATIVE
KETONES UR: NEGATIVE mg/dL
Leukocytes, UA: NEGATIVE
NITRITE: NEGATIVE
Protein, ur: NEGATIVE mg/dL
Specific Gravity, Urine: 1.02 (ref 1.005–1.030)
pH: 5.5 (ref 5.0–8.0)

## 2015-09-23 LAB — RETICULOCYTES
RBC.: 5.66 MIL/uL (ref 4.22–5.81)
RETIC COUNT ABSOLUTE: 34 10*3/uL (ref 19.0–186.0)
RETIC CT PCT: 0.6 % (ref 0.4–3.1)

## 2015-09-23 LAB — TROPONIN I

## 2015-09-23 MED ORDER — SODIUM CHLORIDE 0.9 % IV BOLUS (SEPSIS)
1000.0000 mL | Freq: Once | INTRAVENOUS | Status: AC
  Administered 2015-09-23: 1000 mL via INTRAVENOUS

## 2015-09-23 MED ORDER — HYDROCODONE-ACETAMINOPHEN 5-325 MG PO TABS
1.0000 | ORAL_TABLET | ORAL | Status: DC | PRN
Start: 2015-09-23 — End: 2015-10-18

## 2015-09-23 MED ORDER — HYDROMORPHONE HCL 1 MG/ML IJ SOLN
1.0000 mg | Freq: Once | INTRAMUSCULAR | Status: AC
  Administered 2015-09-23: 1 mg via INTRAVENOUS
  Filled 2015-09-23: qty 1

## 2015-09-23 MED ORDER — ONDANSETRON HCL 4 MG/2ML IJ SOLN
4.0000 mg | Freq: Once | INTRAMUSCULAR | Status: AC
  Administered 2015-09-23: 4 mg via INTRAVENOUS
  Filled 2015-09-23: qty 2

## 2015-09-23 NOTE — Discharge Instructions (Signed)
Your blood tests, ekg, Doppler Ultrasound (no sign of a blood clot in your leg) and chest xray are stable today.  Refer to the instructions below and plan a followup with your doctor this week for a recheck of your symptoms.  You may take the hydrocodone prescribed for pain relief.  This will make you drowsy - do not drive within 4 hours of taking this medication.   Musculoskeletal Pain Musculoskeletal pain is muscle and boney aches and pains. These pains can occur in any part of the body. Your caregiver may treat you without knowing the cause of the pain. They may treat you if blood or urine tests, X-rays, and other tests were normal.  CAUSES There is often not a definite cause or reason for these pains. These pains may be caused by a type of germ (virus). The discomfort may also come from overuse. Overuse includes working out too hard when your body is not fit. Boney aches also come from weather changes. Bone is sensitive to atmospheric pressure changes. HOME CARE INSTRUCTIONS   Ask when your test results will be ready. Make sure you get your test results.  Only take over-the-counter or prescription medicines for pain, discomfort, or fever as directed by your caregiver. If you were given medications for your condition, do not drive, operate machinery or power tools, or sign legal documents for 24 hours. Do not drink alcohol. Do not take sleeping pills or other medications that may interfere with treatment.  Continue all activities unless the activities cause more pain. When the pain lessens, slowly resume normal activities. Gradually increase the intensity and duration of the activities or exercise.  During periods of severe pain, bed rest may be helpful. Lay or sit in any position that is comfortable.  Putting ice on the injured area.  Put ice in a bag.  Place a towel between your skin and the bag.  Leave the ice on for 15 to 20 minutes, 3 to 4 times a day.  Follow up with your  caregiver for continued problems and no reason can be found for the pain. If the pain becomes worse or does not go away, it may be necessary to repeat tests or do additional testing. Your caregiver may need to look further for a possible cause. SEEK IMMEDIATE MEDICAL CARE IF:  You have pain that is getting worse and is not relieved by medications.  You develop chest pain that is associated with shortness or breath, sweating, feeling sick to your stomach (nauseous), or throw up (vomit).  Your pain becomes localized to the abdomen.  You develop any new symptoms that seem different or that concern you. MAKE SURE YOU:   Understand these instructions.  Will watch your condition.  Will get help right away if you are not doing well or get worse.   This information is not intended to replace advice given to you by your health care provider. Make sure you discuss any questions you have with your health care provider.   Document Released: 06/02/2005 Document Revised: 08/25/2011 Document Reviewed: 02/04/2013 Elsevier Interactive Patient Education 2016 [employer]  Peripheral Edema You have swelling in your legs (peripheral edema). This swelling can be due to excess accumulation of salt and water in your body. Edema may be a sign of heart, kidney or liver disease, or a side effect of a medication. It may also be due to problems in the leg veins. Elevating your legs and using special support stockings may be very helpful,  if the cause of the swelling is due to poor venous circulation. Avoid long periods of standing, whatever the cause. Treatment of edema depends on identifying the cause. Chips, pretzels, pickles and other salty foods should be avoided. Restricting salt in your diet is almost always needed. Water pills (diuretics) are often used to remove the excess salt and water from your body via urine. These medicines prevent the kidney from reabsorbing sodium. This increases urine flow. Diuretic  treatment may also result in lowering of potassium levels in your body. Potassium supplements may be needed if you have to use diuretics daily. Daily weights can help you keep track of your progress in clearing your edema. You should call your caregiver for follow up care as recommended. SEEK IMMEDIATE MEDICAL CARE IF:   You have increased swelling, pain, redness, or heat in your legs.  You develop shortness of breath, especially when lying down.  You develop chest or abdominal pain, weakness, or fainting.  You have a fever.   This information is not intended to replace advice given to you by your health care provider. Make sure you discuss any questions you have with your health care provider.   Document Released: 07/10/2004 Document Revised: 08/25/2011 Document Reviewed: 12/13/2014 [employer] Interactive Patient Education [employer]

## 2015-09-23 NOTE — ED Notes (Signed)
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
## 2015-10-05 ENCOUNTER — Emergency Department (HOSPITAL_COMMUNITY)
Admission: EM | Admit: 2015-10-05 | Discharge: 2015-10-05 | Disposition: A | Discharge status: Home / Self Care | Payer: Medicaid Other | Attending: Emergency Medicine | Admitting: Emergency Medicine

## 2015-10-05 ENCOUNTER — Encounter (HOSPITAL_COMMUNITY): Payer: Self-pay

## 2015-10-05 DIAGNOSIS — I252 Old myocardial infarction: Secondary | ICD-10-CM | POA: Insufficient documentation

## 2015-10-05 DIAGNOSIS — N342 Other urethritis: Secondary | ICD-10-CM | POA: Insufficient documentation

## 2015-10-05 DIAGNOSIS — R369 Urethral discharge, unspecified: Secondary | ICD-10-CM | POA: Diagnosis present

## 2015-10-05 DIAGNOSIS — F17210 Nicotine dependence, cigarettes, uncomplicated: Secondary | ICD-10-CM | POA: Insufficient documentation

## 2015-10-05 MED ORDER — CEFTRIAXONE SODIUM 250 MG IJ SOLR
250.0000 mg | Freq: Once | INTRAMUSCULAR | Status: AC
  Administered 2015-10-05: 250 mg via INTRAMUSCULAR
  Filled 2015-10-05: qty 250

## 2015-10-05 MED ORDER — AZITHROMYCIN 250 MG PO TABS
1000.0000 mg | ORAL_TABLET | Freq: Once | ORAL | Status: AC
  Administered 2015-10-05: 1000 mg via ORAL
  Filled 2015-10-05: qty 4

## 2015-10-05 MED ORDER — LIDOCAINE HCL (PF) 1 % IJ SOLN
INTRAMUSCULAR | Status: AC
  Filled 2015-10-05: qty 5

## 2015-10-05 NOTE — ED Notes (Signed)
Lab unable to obtain blood sample, someone else from lab will come to try

## 2015-10-05 NOTE — ED Notes (Signed)
Pt says has drainage from penis after he voids and c/o pain and burning with urination since yesterday.
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
## 2015-10-05 NOTE — Discharge Instructions (Signed)
Urethritis, Adult °Urethritis is an inflammation of the tube through which urine exits your bladder (urethra).  °CAUSES °Urethritis is often caused by an infection in your urethra. The infection can be viral, like herpes. The infection can also be bacterial, like gonorrhea. °RISK FACTORS °Risk factors of urethritis include: °· Having sex without using a condom. °· Having multiple sexual partners. °· Having poor hygiene. °SIGNS AND SYMPTOMS °Symptoms of urethritis are less noticeable in women than in men. These symptoms include: °· Burning feeling when you urinate (dysuria). °· Discharge from your urethra. °· Blood in your urine (hematuria). °· Urinating more than usual. °DIAGNOSIS  °To confirm a diagnosis of urethritis, your health care provider will do the following: °· Ask about your sexual history. °· Perform a physical exam. °· Have you provide a sample of your urine for lab testing. °· Use a cotton swab to gently collect a sample from your urethra for lab testing. °TREATMENT  °It is important to treat urethritis. Depending on the cause, untreated urethritis may lead to serious genital infections and possibly infertility. Urethritis caused by a bacterial infection is treated with antibiotic medicine. All sexual partners must be treated.  °HOME CARE INSTRUCTIONS °· Do not have sex until the test results are known and treatment is completed, even if your symptoms go away before you finish treatment. °· If you were prescribed an antibiotic, finish it all even if you start to feel better. °SEEK MEDICAL CARE IF:  °· Your symptoms are not improved in 3 days. °· Your symptoms are getting worse. °· You develop abdominal pain or pelvic pain (in women). °· You develop joint pain. °· You have a fever. °SEEK IMMEDIATE MEDICAL CARE IF:  °· You have severe pain in the belly, back, or side. °· You have repeated vomiting. °MAKE SURE YOU: °· Understand these instructions. °· Will watch your condition. °· Will get help right away  if you are not doing well or get worse. °  °This information is not intended to replace advice given to you by your health care provider. Make sure you discuss any questions you have with your health care provider. °  °Document Released: 11/26/2000 Document Revised: 10/17/2014 Document Reviewed: 01/31/2013 °Elsevier Interactive Patient Education ©2016 [employer] °Safe Sex °Safe sex is about reducing the risk of giving or getting a sexually transmitted disease (STD). STDs are spread through sexual contact involving the genitals, mouth, or rectum. Some STDs can be cured and others cannot. Safe sex can also prevent unintended pregnancies.  °WHAT ARE SOME SAFE SEX PRACTICES? °· Limit your sexual activity to only one partner who is having sex with only you. °· Talk to your partner about his or her past partners, past STDs, and drug use. °· Use a condom every time you have sexual intercourse. This includes vaginal, oral, and anal sexual activity. Both females and males should wear condoms during oral sex. Only use latex or polyurethane condoms and water-based lubricants. Using petroleum-based lubricants or oils to lubricate a condom will weaken the condom and increase the chance that it will break. The condom should be in place from the beginning to the end of sexual activity. Wearing a condom reduces, but does not completely eliminate, your risk of getting or giving an STD. STDs can be spread by contact with infected body fluids and skin. °· Get vaccinated for hepatitis B and HPV. °· Avoid alcohol and recreational drugs, which can affect your judgment. You may forget to use a condom or participate   in high-risk sex. °· For females, avoid douching after sexual intercourse. Douching can spread an infection farther into the reproductive tract. °· Check your body for signs of sores, blisters, rashes, or unusual discharge. See your health care provider if you notice any of these signs. °· Avoid sexual contact if you have  symptoms of an infection or are being treated for an STD. If you or your partner has herpes, avoid sexual contact when blisters are present. Use condoms at all other times. °· If you are at risk of being infected with HIV, it is recommended that you take a prescription medicine daily to prevent HIV infection. This is called pre-exposure prophylaxis (PrEP). You are considered at risk if: °¨ You are a man who has sex with other men (MSM). °¨ You are a heterosexual man or woman who is sexually active with more than one partner. °¨ You take drugs by injection. °¨ You are sexually active with a partner who has HIV. °· Talk with your health care provider about whether you are at high risk of being infected with HIV. If you choose to begin PrEP, you should first be tested for HIV. You should then be tested every 3 months for as long as you are taking PrEP. °· See your health care provider for regular screenings, exams, and tests for other STDs. Before having sex with a new partner, each of you should be screened for STDs and should talk about the results with each other. °WHAT ARE THE BENEFITS OF SAFE SEX?  °· There is less chance of getting or giving an STD. °· You can prevent unwanted or unintended pregnancies. °· By discussing safe sex concerns with your partner, you may increase feelings of intimacy, comfort, trust, and honesty between the two of you. °  °This information is not intended to replace advice given to you by your health care provider. Make sure you discuss any questions you have with your health care provider. °  °Document Released: 07/10/2004 Document Revised: 06/23/2014 Document Reviewed: 11/24/2011 °Elsevier Interactive Patient Education ©2016 [employer]. ° °

## 2015-10-06 LAB — HIV ANTIBODY (ROUTINE TESTING W REFLEX): HIV SCREEN 4TH GENERATION: NONREACTIVE

## 2015-10-06 LAB — RPR: RPR: NONREACTIVE

## 2015-10-09 LAB — GC/CHLAMYDIA PROBE AMP (~~LOC~~) NOT AT ARMC
Chlamydia: NEGATIVE
Neisseria Gonorrhea: NEGATIVE

## 2015-10-18 ENCOUNTER — Emergency Department (HOSPITAL_COMMUNITY)
Admission: EM | Admit: 2015-10-18 | Discharge: 2015-10-18 | Disposition: A | Discharge status: Home / Self Care | Payer: Medicaid Other | Attending: Emergency Medicine | Admitting: Emergency Medicine

## 2015-10-18 ENCOUNTER — Emergency Department (HOSPITAL_COMMUNITY): Payer: Medicaid Other

## 2015-10-18 ENCOUNTER — Encounter (HOSPITAL_COMMUNITY): Payer: Self-pay | Admitting: Emergency Medicine

## 2015-10-18 DIAGNOSIS — F17210 Nicotine dependence, cigarettes, uncomplicated: Secondary | ICD-10-CM | POA: Insufficient documentation

## 2015-10-18 DIAGNOSIS — I252 Old myocardial infarction: Secondary | ICD-10-CM | POA: Insufficient documentation

## 2015-10-18 DIAGNOSIS — Z7982 Long term (current) use of aspirin: Secondary | ICD-10-CM | POA: Diagnosis not present

## 2015-10-18 DIAGNOSIS — M79604 Pain in right leg: Secondary | ICD-10-CM | POA: Insufficient documentation

## 2015-10-18 DIAGNOSIS — Z79899 Other long term (current) drug therapy: Secondary | ICD-10-CM | POA: Diagnosis not present

## 2015-10-18 DIAGNOSIS — M79606 Pain in leg, unspecified: Secondary | ICD-10-CM

## 2015-10-18 DIAGNOSIS — M79605 Pain in left leg: Secondary | ICD-10-CM | POA: Diagnosis present

## 2015-10-18 MED ORDER — OXYCODONE-ACETAMINOPHEN 5-325 MG PO TABS
2.0000 | ORAL_TABLET | Freq: Once | ORAL | Status: AC
  Administered 2015-10-18: 2 via ORAL
  Filled 2015-10-18: qty 2
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
## 2015-10-18 NOTE — ED Notes (Signed)
Having left leg pain since last Thursday.  Rates pain 10/10.  Did not take any pain medication this am.

## 2015-10-18 NOTE — ED Notes (Signed)
Pt reports that his pain is increasing. Dr Erin HearingMessner notified to give 2 additional percocet 5/325mg 

## 2015-10-30 ENCOUNTER — Ambulatory Visit (INDEPENDENT_AMBULATORY_CARE_PROVIDER_SITE_OTHER): Payer: Medicaid Other | Admitting: Internal Medicine

## 2015-10-30 ENCOUNTER — Encounter: Payer: Self-pay | Admitting: Internal Medicine

## 2015-10-30 VITALS — BP 108/64 | HR 50 | Temp 97.7°F | Resp 20 | Ht 74.0 in | Wt 280.0 lb

## 2015-10-30 DIAGNOSIS — I10 Essential (primary) hypertension: Secondary | ICD-10-CM | POA: Diagnosis not present

## 2015-10-30 DIAGNOSIS — D573 Sickle-cell trait: Secondary | ICD-10-CM | POA: Diagnosis not present

## 2015-10-30 DIAGNOSIS — R600 Localized edema: Secondary | ICD-10-CM | POA: Diagnosis not present

## 2015-10-30 MED ORDER — ACETAMINOPHEN-CODEINE #3 300-30 MG PO TABS: 1.0000 | ORAL_TABLET | Freq: Four times a day (QID) | ORAL | Status: AC | PRN

## 2015-10-30 NOTE — Progress Notes (Signed)
Patient is here to establish care.  Patient has taken medication today. Patient has eaten today.  Patient complains of pain in his joints and needing a refill on SCD medications.
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
## 2015-10-30 NOTE — Patient Instructions (Addendum)
Chronic Pain Chronic pain can be defined as pain that is off and on and lasts for 3-6 months or longer. Many things cause chronic pain, which can make it difficult to make a diagnosis. There are many treatment options available for chronic pain. However, finding a treatment that works well for you may require trying various approaches until the right one is found. Many people benefit from a combination of two or more types of treatment to control their pain. SYMPTOMS  Chronic pain can occur anywhere in the body and can range from mild to very severe. Some types of chronic pain include:  Headache.  Low back pain.  Cancer pain.  Arthritis pain.  Neurogenic pain. This is pain resulting from damage to nerves. People with chronic pain may also have other symptoms such as:  Depression.  Anger.  Insomnia.  Anxiety. DIAGNOSIS  Your health care provider will help diagnose your condition over time. In many cases, the initial focus will be on excluding possible conditions that could be causing the pain. Depending on your symptoms, your health care provider may order tests to diagnose your condition. Some of these tests may include:   Blood tests.   CT scan.   MRI.   X-rays.   Ultrasounds.   Nerve conduction studies.  You may need to see a specialist.  TREATMENT  Finding treatment that works well may take time. You may be referred to a pain specialist. He or she may prescribe medicine or therapies, such as:   Mindful meditation or yoga.  Shots (injections) of numbing or pain-relieving medicines into the spine or area of pain.  Local electrical stimulation.  Acupuncture.   Massage therapy.   Aroma, color, light, or sound therapy.   Biofeedback.   Working with a physical therapist to keep from getting stiff.   Regular, gentle exercise.   Cognitive or behavioral therapy.   Group support.  Sometimes, surgery may be recommended.  HOME CARE INSTRUCTIONS    Take all medicines as directed by your health care provider.   Lessen stress in your life by relaxing and doing things such as listening to calming music.   Exercise or be active as directed by your health care provider.   Eat a healthy diet and include things such as vegetables, fruits, fish, and lean meats in your diet.   Keep all follow-up appointments with your health care provider.   Attend a support group with others suffering from chronic pain. SEEK MEDICAL CARE IF:   Your pain gets worse.   You develop a new pain that was not there before.   You cannot tolerate medicines given to you by your health care provider.   You have new symptoms since your last visit with your health care provider.  SEEK IMMEDIATE MEDICAL CARE IF:   You feel weak.   You have decreased sensation or numbness.   You lose control of bowel or bladder function.   Your pain suddenly gets much worse.   You develop shaking.  You develop chills.  You develop confusion.  You develop chest pain.  You develop shortness of breath.  MAKE SURE YOU:  Understand these instructions.  Will watch your condition.  Will get help right away if you are not doing well or get worse.   This information is not intended to replace advice given to you by your health care provider. Make sure you discuss any questions you have with your health care provider.   Document Released: 02/22/2002  Document Revised: 02/02/2013 Document Reviewed: 11/26/2012 Elsevier Interactive Patient Education 2016 [employer]. Hypertension Hypertension, commonly called high blood pressure, is when the force of blood pumping through your arteries is too strong. Your arteries are the blood vessels that carry blood from your heart throughout your body. A blood pressure reading consists of a higher number over a lower number, such as 110/72. The higher number (systolic) is the pressure inside your arteries when your heart  pumps. The lower number (diastolic) is the pressure inside your arteries when your heart relaxes. Ideally you want your blood pressure below 120/80. Hypertension forces your heart to work harder to pump blood. Your arteries may become narrow or stiff. Having untreated or uncontrolled hypertension can cause heart attack, stroke, kidney disease, and other problems. RISK FACTORS Some risk factors for high blood pressure are controllable. Others are not.  Risk factors you cannot control include:   Race. You may be at higher risk if you are African American.  Age. Risk increases with age.  Gender. Men are at higher risk than women before age 64 years. After age 55, women are at higher risk than men. Risk factors you can control include:  Not getting enough exercise or physical activity.  Being overweight.  Getting too much fat, sugar, calories, or salt in your diet.  Drinking too much alcohol. SIGNS AND SYMPTOMS Hypertension does not usually cause signs or symptoms. Extremely high blood pressure (hypertensive crisis) may cause headache, anxiety, shortness of breath, and nosebleed. DIAGNOSIS To check if you have hypertension, your health care provider will measure your blood pressure while you are seated, with your arm held at the level of your heart. It should be measured at least twice using the same arm. Certain conditions can cause a difference in blood pressure between your right and left arms. A blood pressure reading that is higher than normal on one occasion does not mean that you need treatment. If it is not clear whether you have high blood pressure, you may be asked to return on a different day to have your blood pressure checked again. Or, you may be asked to monitor your blood pressure at home for 1 or more weeks. TREATMENT Treating high blood pressure includes making lifestyle changes and possibly taking medicine. Living a healthy lifestyle can help lower high blood pressure. You may  need to change some of your habits. Lifestyle changes may include:  Following the DASH diet. This diet is high in fruits, vegetables, and whole grains. It is low in salt, red meat, and added sugars.  Keep your sodium intake below 2,300 mg per day.  Getting at least 30-45 minutes of aerobic exercise at least 4 times per week.  Losing weight if necessary.  Not smoking.  Limiting alcoholic beverages.  Learning ways to reduce stress. Your health care provider may prescribe medicine if lifestyle changes are not enough to get your blood pressure under control, and if one of the following is true:  You are 45-52 years of age and your systolic blood pressure is above 140.  You are 98 years of age or older, and your systolic blood pressure is above 150.  Your diastolic blood pressure is above 90.  You have diabetes, and your systolic blood pressure is over 140 or your diastolic blood pressure is over 90.  You have kidney disease and your blood pressure is above 140/90.  You have heart disease and your blood pressure is above 140/90. Your personal target blood pressure may  vary depending on your medical conditions, your age, and other factors. HOME CARE INSTRUCTIONS  Have your blood pressure rechecked as directed by your health care provider.   Take medicines only as directed by your health care provider. Follow the directions carefully. Blood pressure medicines must be taken as prescribed. The medicine does not work as well when you skip doses. Skipping doses also puts you at risk for problems.  Do not smoke.   Monitor your blood pressure at home as directed by your health care provider. SEEK MEDICAL CARE IF:   You think you are having a reaction to medicines taken.  You have recurrent headaches or feel dizzy.  You have swelling in your ankles.  You have trouble with your vision. SEEK IMMEDIATE MEDICAL CARE IF:  You develop a severe headache or confusion.  You have  unusual weakness, numbness, or feel faint.  You have severe chest or abdominal pain.  You vomit repeatedly.  You have trouble breathing. MAKE SURE YOU:   Understand these instructions.  Will watch your condition.  Will get help right away if you are not doing well or get worse.   This information is not intended to replace advice given to you by your health care provider. Make sure you discuss any questions you have with your health care provider.   Document Released: 06/02/2005 Document Revised: 10/17/2014 Document Reviewed: 03/25/2013 Elsevier Interactive Patient Education 2016 [employer] Edema Edema is an abnormal buildup of fluids in your bodytissues. Edema is somewhatdependent on gravity to pull the fluid to the lowest place in your body. That makes the condition more common in the legs and thighs (lower extremities). Painless swelling of the feet and ankles is common and becomes more likely as you get older. It is also common in looser tissues, like around your eyes.  When the affected area is squeezed, the fluid may move out of that spot and leave a dent for a few moments. This dent is called pitting.  CAUSES  There are many possible causes of edema. Eating too much salt and being on your feet or sitting for a long time can cause edema in your legs and ankles. Hot weather may make edema worse. Common medical causes of edema include:  Heart failure.  Liver disease.  Kidney disease.  Weak blood vessels in your legs.  Cancer.  An injury.  Pregnancy.  Some medications.  Obesity. SYMPTOMS  Edema is usually painless.Your skin may look swollen or shiny.  DIAGNOSIS  Your health care provider may be able to diagnose edema by asking about your medical history and doing a physical exam. You may need to have tests such as X-rays, an electrocardiogram, or blood tests to check for medical conditions that may cause edema.  TREATMENT  Edema treatment depends on the  cause. If you have heart, liver, or kidney disease, you need the treatment appropriate for these conditions. General treatment may include:  Elevation of the affected body part above the level of your heart.  Compression of the affected body part. Pressure from elastic bandages or support stockings squeezes the tissues and forces fluid back into the blood vessels. This keeps fluid from entering the tissues.  Restriction of fluid and salt intake.  Use of a water pill (diuretic). These medications are appropriate only for some types of edema. They pull fluid out of your body and make you urinate more often. This gets rid of fluid and reduces swelling, but diuretics can have side effects. Only  use diuretics as directed by your health care provider. HOME CARE INSTRUCTIONS   Keep the affected body part above the level of your heart when you are lying down.   Do not sit still or stand for prolonged periods.   Do not put anything directly under your knees when lying down.  Do not wear constricting clothing or garters on your upper legs.   Exercise your legs to work the fluid back into your blood vessels. This may help the swelling go down.   Wear elastic bandages or support stockings to reduce ankle swelling as directed by your health care provider.   Eat a low-salt diet to reduce fluid if your health care provider recommends it.   Only take medicines as directed by your health care provider. SEEK MEDICAL CARE IF:   Your edema is not responding to treatment.  You have heart, liver, or kidney disease and notice symptoms of edema.  You have edema in your legs that does not improve after elevating them.   You have sudden and unexplained weight gain. SEEK IMMEDIATE MEDICAL CARE IF:   You develop shortness of breath or chest pain.   You cannot breathe when you lie down.  You develop pain, redness, or warmth in the swollen areas.   You have heart, liver, or kidney disease and  suddenly get edema.  You have a fever and your symptoms suddenly get worse. MAKE SURE YOU:   Understand these instructions.  Will watch your condition.  Will get help right away if you are not doing well or get worse.   This information is not intended to replace advice given to you by your health care provider. Make sure you discuss any questions you have with your health care provider.   Document Released: 06/02/2005 Document Revised: 06/23/2014 Document Reviewed: 03/25/2013 Elsevier Interactive Patient Education [employer] Inc[employer]

## 2015-10-31 LAB — CBC WITH DIFFERENTIAL/PLATELET
Basophils Absolute: 37 cells/uL (ref 0–200)
Basophils Relative: 1 %
EOS PCT: 8 %
Eosinophils Absolute: 296 cells/uL (ref 15–500)
HCT: 40 % (ref 38.5–50.0)
HEMOGLOBIN: 13.2 g/dL (ref 13.2–17.1)
LYMPHS ABS: 1739 {cells}/uL (ref 850–3900)
Lymphocytes Relative: 47 %
MCH: 24.6 pg — ABNORMAL LOW (ref 27.0–33.0)
MCHC: 33 g/dL (ref 32.0–36.0)
MCV: 74.5 fL — ABNORMAL LOW (ref 80.0–100.0)
MONOS PCT: 11 %
MPV: 8.5 fL (ref 7.5–12.5)
Monocytes Absolute: 407 cells/uL (ref 200–950)
NEUTROS ABS: 1221 {cells}/uL — AB (ref 1500–7800)
Neutrophils Relative %: 33 %
PLATELETS: 159 10*3/uL (ref 140–400)
RBC: 5.37 MIL/uL (ref 4.20–5.80)
RDW: 18.9 % — ABNORMAL HIGH (ref 11.0–15.0)
WBC: 3.7 10*3/uL — AB (ref 3.8–10.8)

## 2015-10-31 LAB — COMPLETE METABOLIC PANEL WITH GFR
ALT: 37 U/L (ref 9–46)
AST: 31 U/L (ref 10–40)
Albumin: 3.9 g/dL (ref 3.6–5.1)
Alkaline Phosphatase: 88 U/L (ref 40–115)
BUN: 13 mg/dL (ref 7–25)
CHLORIDE: 104 mmol/L (ref 98–110)
CO2: 23 mmol/L (ref 20–31)
Calcium: 8.6 mg/dL (ref 8.6–10.3)
Creat: 0.83 mg/dL (ref 0.60–1.35)
GFR, Est African American: >89 mL/min (ref >=60)
GFR, Est Non African American: >89 mL/min (ref >=60)
GLUCOSE: 105 mg/dL — AB (ref 65–99)
POTASSIUM: 4.2 mmol/L (ref 3.5–5.3)
SODIUM: 142 mmol/L (ref 135–146)
Total Bilirubin: 0.4 mg/dL (ref 0.2–1.2)
Total Protein: 6.6 g/dL (ref 6.1–8.1)

## 2015-10-31 LAB — LIPID PANEL
CHOL/HDL RATIO: 2.6 ratio (ref <=5.0)
Cholesterol: 167 mg/dL (ref 125–200)
HDL: 64 mg/dL (ref >=40)
LDL Cholesterol: 81 mg/dL (ref <130)
Triglycerides: 112 mg/dL (ref <150)
VLDL: 22 mg/dL (ref <30)

## 2015-10-31 LAB — HEMOGLOBIN A1C
Hgb A1c MFr Bld: 5.9 % — ABNORMAL HIGH (ref <5.7)
Mean Plasma Glucose: 123 mg/dL

## 2015-10-31 LAB — TSH: TSH: 1.04 m[IU]/L (ref 0.40–4.50)

## 2015-11-02 LAB — HEMOGLOBINOPATHY EVALUATION
HGB A: 61.8 % — AB (ref >96.0)
Hemoglobin Other: 0 %
Hgb A2 Quant: 3.4 % (ref 1.8–3.5)
Hgb F Quant: 0 % (ref <2.0)
Hgb S Quant: 34.8 % — ABNORMAL HIGH

## 2015-11-13 ENCOUNTER — Telehealth: Payer: Self-pay | Admitting: *Deleted

## 2015-11-13 NOTE — Telephone Encounter (Signed)
-----   Message from Quentin Angstlugbemiga E Jegede, MD sent at 11/07/2015 10:55 AM EDT ----- Please inform patient that his laboratory results are mostly normal. 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

## 2015-11-13 NOTE — Telephone Encounter (Signed)
Medical Assistant left message on patient's home and cell voicemail. Voicemail states to give a call back to Augustin Bun with [facility] at [phone].  

## 2015-11-30 ENCOUNTER — Emergency Department (HOSPITAL_COMMUNITY): Payer: Medicaid Other

## 2015-11-30 ENCOUNTER — Emergency Department (HOSPITAL_COMMUNITY)
Admission: EM | Admit: 2015-11-30 | Discharge: 2015-11-30 | Disposition: A | Discharge status: Home / Self Care | Payer: Medicaid Other | Attending: Emergency Medicine | Admitting: Emergency Medicine

## 2015-11-30 ENCOUNTER — Encounter (HOSPITAL_COMMUNITY): Payer: Self-pay

## 2015-11-30 DIAGNOSIS — R001 Bradycardia, unspecified: Secondary | ICD-10-CM | POA: Insufficient documentation

## 2015-11-30 DIAGNOSIS — R109 Unspecified abdominal pain: Secondary | ICD-10-CM

## 2015-11-30 DIAGNOSIS — Z7982 Long term (current) use of aspirin: Secondary | ICD-10-CM | POA: Diagnosis not present

## 2015-11-30 DIAGNOSIS — R112 Nausea with vomiting, unspecified: Secondary | ICD-10-CM | POA: Diagnosis not present

## 2015-11-30 DIAGNOSIS — R197 Diarrhea, unspecified: Secondary | ICD-10-CM | POA: Insufficient documentation

## 2015-11-30 LAB — COMPREHENSIVE METABOLIC PANEL
ALBUMIN: 3.6 g/dL (ref 3.5–5.0)
ALT: 33 U/L (ref 17–63)
ANION GAP: 2 — AB (ref 5–15)
AST: 30 U/L (ref 15–41)
Alkaline Phosphatase: 76 U/L (ref 38–126)
BILIRUBIN TOTAL: 0.4 mg/dL (ref 0.3–1.2)
BUN: 15 mg/dL (ref 6–20)
CALCIUM: 8.3 mg/dL — AB (ref 8.9–10.3)
CO2: 29 mmol/L (ref 22–32)
Chloride: 109 mmol/L (ref 101–111)
Creatinine, Ser: 0.95 mg/dL (ref 0.61–1.24)
GFR calc Af Amer: >60 mL/min (ref >60)
GLUCOSE: 89 mg/dL (ref 65–99)
Potassium: 3.9 mmol/L (ref 3.5–5.1)
Sodium: 140 mmol/L (ref 135–145)
TOTAL PROTEIN: 6.3 g/dL — AB (ref 6.5–8.1)

## 2015-11-30 LAB — URINALYSIS, ROUTINE W REFLEX MICROSCOPIC
Bilirubin Urine: NEGATIVE
GLUCOSE, UA: NEGATIVE mg/dL
HGB URINE DIPSTICK: NEGATIVE
KETONES UR: NEGATIVE mg/dL
LEUKOCYTES UA: NEGATIVE
Nitrite: NEGATIVE
PROTEIN: NEGATIVE mg/dL
Specific Gravity, Urine: 1.02 (ref 1.005–1.030)
pH: 5.5 (ref 5.0–8.0)

## 2015-11-30 LAB — CBC WITH DIFFERENTIAL/PLATELET
BASOS PCT: 1 %
Basophils Absolute: 0 10*3/uL (ref 0.0–0.1)
Eosinophils Absolute: 0.2 10*3/uL (ref 0.0–0.7)
Eosinophils Relative: 5 %
HEMATOCRIT: 36.8 % — AB (ref 39.0–52.0)
HEMOGLOBIN: 12.3 g/dL — AB (ref 13.0–17.0)
LYMPHS PCT: 50 %
Lymphs Abs: 2.2 10*3/uL (ref 0.7–4.0)
MCH: 25.9 pg — ABNORMAL LOW (ref 26.0–34.0)
MCHC: 33.4 g/dL (ref 30.0–36.0)
MCV: 77.6 fL — AB (ref 78.0–100.0)
MONOS PCT: 9 %
Monocytes Absolute: 0.4 10*3/uL (ref 0.1–1.0)
NEUTROS ABS: 1.5 10*3/uL — AB (ref 1.7–7.7)
NEUTROS PCT: 35 %
Platelets: 169 10*3/uL (ref 150–400)
RBC: 4.74 MIL/uL (ref 4.22–5.81)
RDW: 16.4 % — ABNORMAL HIGH (ref 11.5–15.5)
WBC: 4.3 10*3/uL (ref 4.0–10.5)

## 2015-11-30 LAB — LIPASE, BLOOD: Lipase: 24 U/L (ref 11–51)

## 2015-11-30 MED ORDER — DIATRIZOATE MEGLUMINE & SODIUM 66-10 % PO SOLN
ORAL | Status: AC
  Filled 2015-11-30: qty 30

## 2015-11-30 MED ORDER — IOPAMIDOL (ISOVUE-300) INJECTION 61%
100.0000 mL | Freq: Once | INTRAVENOUS | Status: AC | PRN
  Administered 2015-11-30: 100 mL via INTRAVENOUS

## 2015-11-30 MED ORDER — ONDANSETRON HCL 4 MG/2ML IJ SOLN
4.0000 mg | INTRAMUSCULAR | Status: AC
  Administered 2015-11-30: 4 mg via INTRAMUSCULAR

## 2015-11-30 MED ORDER — FENTANYL CITRATE (PF) 100 MCG/2ML IJ SOLN
50.0000 ug | Freq: Once | INTRAMUSCULAR | Status: AC
  Administered 2015-11-30: 50 ug via INTRAMUSCULAR
  Filled 2015-11-30: qty 2

## 2015-11-30 MED ORDER — MORPHINE SULFATE (PF) 4 MG/ML IV SOLN
4.0000 mg | Freq: Once | INTRAVENOUS | Status: DC
  Filled 2015-11-30: qty 1

## 2015-11-30 MED ORDER — ONDANSETRON HCL 4 MG PO TABS: 4.0000 mg | ORAL_TABLET | Freq: Four times a day (QID) | ORAL | Status: AC

## 2015-11-30 MED ORDER — ONDANSETRON HCL 4 MG/2ML IJ SOLN
4.0000 mg | Freq: Once | INTRAMUSCULAR | Status: AC
  Administered 2015-11-30: 4 mg via INTRAVENOUS
  Filled 2015-11-30: qty 2

## 2015-11-30 MED ORDER — HYDROCODONE-ACETAMINOPHEN 5-325 MG PO TABS
2.0000 | ORAL_TABLET | Freq: Once | ORAL | Status: AC
  Administered 2015-11-30: 2 via ORAL
  Filled 2015-11-30: qty 2

## 2015-11-30 MED ORDER — FENTANYL CITRATE (PF) 100 MCG/2ML IJ SOLN: 50.0000 ug | Freq: Once | INTRAMUSCULAR | Status: DC

## 2015-11-30 MED ORDER — ONDANSETRON HCL 4 MG/2ML IJ SOLN
4.0000 mg | Freq: Once | INTRAMUSCULAR | Status: DC
  Filled 2015-11-30: qty 2

## 2015-11-30 MED ORDER — SODIUM CHLORIDE 0.9 % IV BOLUS (SEPSIS)
1000.0000 mL | Freq: Once | INTRAVENOUS | Status: AC
  Administered 2015-11-30: 1000 mL via INTRAVENOUS

## 2015-11-30 NOTE — ED Notes (Signed)
Pt c/o abd pain and vomiting since Monday.

## 2015-11-30 NOTE — ED Notes (Signed)
Unable to obtain IV by 2 nurses. Dr. Lajean Saverancor made 2 attempts to obtain IV via ultrasound but was unsuccessful although blood was obtained.
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
## 2015-11-30 NOTE — ED Notes (Signed)
Per Dr. Lajean Saverancor- give medications IM at this time.

## 2015-11-30 NOTE — ED Notes (Signed)
Informed Dr. Lajean Saverancor of heart rate. VO to obtain EKG. New orders carried out. Patient reports he normally has a low heart rate. Denies any symptoms.

## 2015-11-30 NOTE — Discharge Instructions (Signed)
Abdominal Pain, Adult There is no evidence of bowel obstruction. Follow up with your doctor. Stop taking metoprolol and follow up with the cardiologist for your slow heart rate. Return to the ED if you develop new or worsening symptoms. Many things can cause abdominal pain. Usually, abdominal pain is not caused by a disease and will improve without treatment. It can often be observed and treated at home. Your health care provider will do a physical exam and possibly order blood tests and X-rays to help determine the seriousness of your pain. However, in many cases, more time must pass before a clear cause of the pain can be found. Before that point, your health care provider may not know if you need more testing or further treatment. HOME CARE INSTRUCTIONS Monitor your abdominal pain for any changes. The following actions may help to alleviate any discomfort you are experiencing:  Only take over-the-counter or prescription medicines as directed by your health care provider.  Do not take laxatives unless directed to do so by your health care provider.  Try a clear liquid diet (broth, tea, or water) as directed by your health care provider. Slowly move to a bland diet as tolerated. SEEK MEDICAL CARE IF:  You have unexplained abdominal pain.  You have abdominal pain associated with nausea or diarrhea.  You have pain when you urinate or have a bowel movement.  You experience abdominal pain that wakes you in the night.  You have abdominal pain that is worsened or improved by eating food.  You have abdominal pain that is worsened with eating fatty foods.  You have a fever. SEEK IMMEDIATE MEDICAL CARE IF:  Your pain does not go away within 2 hours.  You keep throwing up (vomiting).  Your pain is felt only in portions of the abdomen, such as the right side or the left lower portion of the abdomen.  You pass bloody or black tarry stools. MAKE SURE YOU:  Understand these  instructions.  Will watch your condition.  Will get help right away if you are not doing well or get worse.   This information is not intended to replace advice given to you by your health care provider. Make sure you discuss any questions you have with your health care provider.   Document Released: 03/12/2005 Document Revised: 02/21/2015 Document Reviewed: 02/09/2013 Elsevier Interactive Patient Education [employer]2016 [employer] Inc.

## 2015-11-30 NOTE — ED Notes (Signed)
Pt request pain medication for abdominal pain. EDP aware

## 2015-11-30 NOTE — ED Notes (Signed)
Pt unhappy that he has not received a pain med prescription- refuses to sign until sees EDP- EDP to room and declines to write pain med script asking pt to take home pain meds- pt irate and refuses to sign DC instructions saying f...  u to staff as leaves ambulatory erect with relaxed facial features

## 2015-11-30 NOTE — ED Notes (Signed)
Nurse notified of heartrate.  Patient stated that he usually had a low heartrate.

## 2015-12-16 ENCOUNTER — Emergency Department (HOSPITAL_COMMUNITY): Payer: Medicaid Other

## 2015-12-16 ENCOUNTER — Encounter (HOSPITAL_COMMUNITY): Payer: Self-pay | Admitting: Emergency Medicine

## 2015-12-16 ENCOUNTER — Emergency Department (HOSPITAL_COMMUNITY)
Admission: EM | Admit: 2015-12-16 | Discharge: 2015-12-16 | Disposition: A | Discharge status: Home / Self Care | Payer: Medicaid Other | Attending: Emergency Medicine | Admitting: Emergency Medicine

## 2015-12-16 DIAGNOSIS — L0889 Other specified local infections of the skin and subcutaneous tissue: Secondary | ICD-10-CM | POA: Insufficient documentation

## 2015-12-16 DIAGNOSIS — Z7982 Long term (current) use of aspirin: Secondary | ICD-10-CM | POA: Diagnosis not present

## 2015-12-16 DIAGNOSIS — F17210 Nicotine dependence, cigarettes, uncomplicated: Secondary | ICD-10-CM | POA: Diagnosis not present

## 2015-12-16 DIAGNOSIS — Z79899 Other long term (current) drug therapy: Secondary | ICD-10-CM | POA: Diagnosis not present

## 2015-12-16 DIAGNOSIS — M79674 Pain in right toe(s): Secondary | ICD-10-CM | POA: Diagnosis present

## 2015-12-16 DIAGNOSIS — L089 Local infection of the skin and subcutaneous tissue, unspecified: Secondary | ICD-10-CM

## 2015-12-16 DIAGNOSIS — I252 Old myocardial infarction: Secondary | ICD-10-CM | POA: Insufficient documentation

## 2015-12-16 MED ORDER — TRAMADOL HCL 50 MG PO TABS: 50.0000 mg | ORAL_TABLET | Freq: Four times a day (QID) | ORAL | Status: AC | PRN

## 2015-12-16 MED ORDER — DOXYCYCLINE HYCLATE 100 MG PO TABS: 100.0000 mg | ORAL_TABLET | Freq: Two times a day (BID) | ORAL | Status: DC

## 2015-12-16 MED ORDER — AMOXICILLIN-POT CLAVULANATE 875-125 MG PO TABS
1.0000 | ORAL_TABLET | Freq: Once | ORAL | Status: AC
  Administered 2015-12-16: 1 via ORAL
  Filled 2015-12-16: qty 1

## 2015-12-16 MED ORDER — DOXYCYCLINE HYCLATE 100 MG PO TABS
100.0000 mg | ORAL_TABLET | Freq: Once | ORAL | Status: AC
  Administered 2015-12-16: 100 mg via ORAL
  Filled 2015-12-16: qty 1

## 2015-12-16 MED ORDER — ONDANSETRON HCL 4 MG PO TABS
4.0000 mg | ORAL_TABLET | Freq: Once | ORAL | Status: AC
  Administered 2015-12-16: 4 mg via ORAL
  Filled 2015-12-16: qty 1

## 2015-12-16 MED ORDER — HYDROCODONE-ACETAMINOPHEN 5-325 MG PO TABS
2.0000 | ORAL_TABLET | Freq: Once | ORAL | Status: AC
  Administered 2015-12-16: 2 via ORAL
  Filled 2015-12-16: qty 2

## 2015-12-16 NOTE — Discharge Instructions (Signed)
Please cleanse your foot with soap and water daily. Please apply dressing to your great toe, and continue to use your shoe device until healed. The x-ray of your foot is negative for infection involving the bone. Please use doxycycline daily until taken. Use Tylenol or ibuprofen for mild pain, use Ultram for more severe pain. Ultram may cause drowsiness, please use with caution.
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
## 2015-12-16 NOTE — ED Notes (Signed)
Pt c/o right great toe pain and drainage. Pt states he saw podiatrist on Wednesday and had nail removed due to infection.

## 2015-12-19 ENCOUNTER — Encounter (HOSPITAL_COMMUNITY): Payer: Self-pay | Admitting: Emergency Medicine

## 2015-12-19 ENCOUNTER — Emergency Department (HOSPITAL_COMMUNITY)
Admission: EM | Admit: 2015-12-19 | Discharge: 2015-12-19 | Disposition: A | Discharge status: Home / Self Care | Payer: Medicaid Other | Attending: Emergency Medicine | Admitting: Emergency Medicine

## 2015-12-19 DIAGNOSIS — F17210 Nicotine dependence, cigarettes, uncomplicated: Secondary | ICD-10-CM | POA: Diagnosis not present

## 2015-12-19 DIAGNOSIS — Z7982 Long term (current) use of aspirin: Secondary | ICD-10-CM | POA: Diagnosis not present

## 2015-12-19 DIAGNOSIS — L089 Local infection of the skin and subcutaneous tissue, unspecified: Secondary | ICD-10-CM | POA: Diagnosis not present

## 2015-12-19 DIAGNOSIS — M79674 Pain in right toe(s): Secondary | ICD-10-CM | POA: Diagnosis present

## 2015-12-19 LAB — BASIC METABOLIC PANEL
ANION GAP: 6 (ref 5–15)
BUN: 16 mg/dL (ref 6–20)
CHLORIDE: 107 mmol/L (ref 101–111)
CO2: 26 mmol/L (ref 22–32)
CREATININE: 0.84 mg/dL (ref 0.61–1.24)
Calcium: 8.5 mg/dL — ABNORMAL LOW (ref 8.9–10.3)
GFR calc non Af Amer: >60 mL/min (ref >60)
Glucose, Bld: 93 mg/dL (ref 65–99)
POTASSIUM: 3.9 mmol/L (ref 3.5–5.1)
SODIUM: 139 mmol/L (ref 135–145)

## 2015-12-19 LAB — CBC WITH DIFFERENTIAL/PLATELET
BASOS ABS: 0 10*3/uL (ref 0.0–0.1)
BASOS PCT: 1 %
EOS ABS: 0.2 10*3/uL (ref 0.0–0.7)
Eosinophils Relative: 6 %
HEMATOCRIT: 38.4 % — AB (ref 39.0–52.0)
HEMOGLOBIN: 12.7 g/dL — AB (ref 13.0–17.0)
Lymphocytes Relative: 38 %
Lymphs Abs: 1.5 10*3/uL (ref 0.7–4.0)
MCH: 26.1 pg (ref 26.0–34.0)
MCHC: 33.1 g/dL (ref 30.0–36.0)
MCV: 78.9 fL (ref 78.0–100.0)
MONOS PCT: 8 %
Monocytes Absolute: 0.3 10*3/uL (ref 0.1–1.0)
NEUTROS PCT: 47 %
Neutro Abs: 1.9 10*3/uL (ref 1.7–7.7)
Platelets: 154 10*3/uL (ref 150–400)
RBC: 4.87 MIL/uL (ref 4.22–5.81)
RDW: 14.7 % (ref 11.5–15.5)
WBC: 4 10*3/uL (ref 4.0–10.5)

## 2015-12-19 MED ORDER — HYDROCODONE-ACETAMINOPHEN 5-325 MG PO TABS: 2.0000 | ORAL_TABLET | ORAL | Status: DC | PRN
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
## 2015-12-19 NOTE — Discharge Instructions (Signed)

## 2015-12-19 NOTE — ED Notes (Addendum)
Patient complaining of pain to right great toe. States "I was here the other day then I went to the podiatrist on Monday and he said the toe nail was infected so he took it off. He told me to soak it and gave me antibiotics but it still hurts." Patient states he has green drainage coming from site.

## 2015-12-24 MED FILL — Hydrocodone-Acetaminophen Tab 5-325 MG: ORAL | Qty: 6 | Status: AC

## 2015-12-25 ENCOUNTER — Encounter (HOSPITAL_COMMUNITY): Payer: Self-pay | Admitting: Emergency Medicine

## 2015-12-25 ENCOUNTER — Emergency Department (HOSPITAL_COMMUNITY)
Admission: EM | Admit: 2015-12-25 | Discharge: 2015-12-25 | Disposition: A | Discharge status: Home / Self Care | Payer: Medicaid Other | Attending: Dermatology | Admitting: Dermatology

## 2015-12-25 DIAGNOSIS — M79672 Pain in left foot: Secondary | ICD-10-CM | POA: Insufficient documentation

## 2015-12-25 DIAGNOSIS — Z79899 Other long term (current) drug therapy: Secondary | ICD-10-CM | POA: Insufficient documentation

## 2015-12-25 DIAGNOSIS — F17210 Nicotine dependence, cigarettes, uncomplicated: Secondary | ICD-10-CM | POA: Diagnosis not present

## 2015-12-25 DIAGNOSIS — Z5321 Procedure and treatment not carried out due to patient leaving prior to being seen by health care provider: Secondary | ICD-10-CM | POA: Insufficient documentation

## 2015-12-25 DIAGNOSIS — Z7982 Long term (current) use of aspirin: Secondary | ICD-10-CM | POA: Insufficient documentation

## 2015-12-25 NOTE — ED Notes (Signed)
Pt reports bleeding and pus coming from RT great toe where he had a toenail removed. Pt recently seen for same complaint.

## 2015-12-25 NOTE — ED Notes (Signed)
Reported by registration that pt left.  

## 2015-12-26 ENCOUNTER — Emergency Department (HOSPITAL_COMMUNITY)
Admission: EM | Admit: 2015-12-26 | Discharge: 2015-12-26 | Disposition: A | Discharge status: Home / Self Care | Payer: Medicaid Other | Attending: Emergency Medicine | Admitting: Emergency Medicine

## 2015-12-26 ENCOUNTER — Encounter (HOSPITAL_COMMUNITY): Payer: Self-pay | Admitting: Emergency Medicine

## 2015-12-26 DIAGNOSIS — Z7982 Long term (current) use of aspirin: Secondary | ICD-10-CM | POA: Diagnosis not present

## 2015-12-26 DIAGNOSIS — I252 Old myocardial infarction: Secondary | ICD-10-CM | POA: Insufficient documentation

## 2015-12-26 DIAGNOSIS — Z79899 Other long term (current) drug therapy: Secondary | ICD-10-CM | POA: Insufficient documentation

## 2015-12-26 DIAGNOSIS — R569 Unspecified convulsions: Secondary | ICD-10-CM | POA: Insufficient documentation

## 2015-12-26 DIAGNOSIS — L089 Local infection of the skin and subcutaneous tissue, unspecified: Secondary | ICD-10-CM | POA: Diagnosis not present

## 2015-12-26 DIAGNOSIS — M79674 Pain in right toe(s): Secondary | ICD-10-CM | POA: Diagnosis present

## 2015-12-26 DIAGNOSIS — F17210 Nicotine dependence, cigarettes, uncomplicated: Secondary | ICD-10-CM | POA: Insufficient documentation

## 2015-12-26 MED ORDER — IBUPROFEN 800 MG PO TABS
800.0000 mg | ORAL_TABLET | Freq: Once | ORAL | Status: AC
  Administered 2015-12-26: 800 mg via ORAL
  Filled 2015-12-26: qty 1

## 2015-12-26 MED ORDER — HYDROCODONE-ACETAMINOPHEN 5-325 MG PO TABS
2.0000 | ORAL_TABLET | Freq: Once | ORAL | Status: AC
  Administered 2015-12-26: 2 via ORAL
  Filled 2015-12-26: qty 2

## 2015-12-26 MED ORDER — AMOXICILLIN-POT CLAVULANATE 875-125 MG PO TABS: 1.0000 | ORAL_TABLET | Freq: Two times a day (BID) | ORAL | Status: AC

## 2015-12-26 MED ORDER — HYDROCODONE-ACETAMINOPHEN 5-325 MG PO TABS: 1.0000 | ORAL_TABLET | ORAL | Status: AC | PRN

## 2015-12-26 MED ORDER — AMOXICILLIN-POT CLAVULANATE 875-125 MG PO TABS
1.0000 | ORAL_TABLET | Freq: Once | ORAL | Status: AC
  Administered 2015-12-26: 1 via ORAL
  Filled 2015-12-26: qty 1

## 2015-12-26 NOTE — Discharge Instructions (Signed)
Please continue to soak your toe in warm Epsom salt water. Please elevate your foot above your waist is much as possible. Please finish your doxycycline. Please add Augmentin 2 times daily with a meal. Use Tylenol or ibuprofen for mild pain, use Norco for severe pain. Please see Dr. Felecia ShellingFanta, or your podiatrist to reassess your toe due to slow healing.
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
## 2015-12-26 NOTE — ED Notes (Signed)
PT c/o right great toe pain after removal of toenail by podiatry x2 weeks ago. PT states was seen in ED for same on 12/19/15.

## 2016-01-18 ENCOUNTER — Emergency Department (HOSPITAL_COMMUNITY)
Admission: EM | Admit: 2016-01-18 | Discharge: 2016-01-18 | Disposition: A | Discharge status: Home / Self Care | Payer: Medicaid Other | Attending: Emergency Medicine | Admitting: Emergency Medicine

## 2016-01-18 ENCOUNTER — Encounter (HOSPITAL_COMMUNITY): Payer: Self-pay | Admitting: Emergency Medicine

## 2016-01-18 DIAGNOSIS — I10 Essential (primary) hypertension: Secondary | ICD-10-CM | POA: Diagnosis not present

## 2016-01-18 DIAGNOSIS — Z7982 Long term (current) use of aspirin: Secondary | ICD-10-CM | POA: Diagnosis not present

## 2016-01-18 DIAGNOSIS — Z4801 Encounter for change or removal of surgical wound dressing: Secondary | ICD-10-CM | POA: Insufficient documentation

## 2016-01-18 DIAGNOSIS — Z79899 Other long term (current) drug therapy: Secondary | ICD-10-CM | POA: Diagnosis not present

## 2016-01-18 DIAGNOSIS — F17210 Nicotine dependence, cigarettes, uncomplicated: Secondary | ICD-10-CM | POA: Insufficient documentation

## 2016-01-18 DIAGNOSIS — Z5189 Encounter for other specified aftercare: Secondary | ICD-10-CM

## 2016-01-18 DIAGNOSIS — M79675 Pain in left toe(s): Secondary | ICD-10-CM | POA: Diagnosis present

## 2016-01-18 MED ORDER — ACETAMINOPHEN 325 MG PO TABS
650.0000 mg | ORAL_TABLET | Freq: Once | ORAL | Status: AC
  Administered 2016-01-18: 650 mg via ORAL
  Filled 2016-01-18: qty 2

## 2016-01-18 NOTE — ED Triage Notes (Signed)
Pt states his right great toenail was removed a week ago and when the dressing is removed it begins bleeding.  No bleeding noted at triage.
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
## 2016-01-18 NOTE — ED Notes (Signed)
Pt alert & oriented x4, stable gait. Patient given discharge instructions, paperwork & prescription(s). Patient  instructed to stop at the registration desk to finish any additional paperwork. Patient verbalized understanding. Pt left department w/ no further questions. 

## 2016-01-18 NOTE — ED Notes (Signed)
Dressing removed from toe.  Dressing pulling , had to wet to get dressing off.

## 2016-01-27 ENCOUNTER — Emergency Department (HOSPITAL_COMMUNITY): Payer: Medicaid Other

## 2016-01-27 ENCOUNTER — Encounter (HOSPITAL_COMMUNITY): Payer: Self-pay | Admitting: Emergency Medicine

## 2016-01-27 ENCOUNTER — Emergency Department (HOSPITAL_COMMUNITY)
Admission: EM | Admit: 2016-01-27 | Discharge: 2016-01-27 | Disposition: A | Discharge status: Home / Self Care | Payer: Medicaid Other | Attending: Emergency Medicine | Admitting: Emergency Medicine

## 2016-01-27 DIAGNOSIS — R200 Anesthesia of skin: Secondary | ICD-10-CM | POA: Insufficient documentation

## 2016-01-27 DIAGNOSIS — R52 Pain, unspecified: Secondary | ICD-10-CM

## 2016-01-27 DIAGNOSIS — R509 Fever, unspecified: Secondary | ICD-10-CM | POA: Insufficient documentation

## 2016-01-27 DIAGNOSIS — F17210 Nicotine dependence, cigarettes, uncomplicated: Secondary | ICD-10-CM | POA: Insufficient documentation

## 2016-01-27 DIAGNOSIS — M7989 Other specified soft tissue disorders: Secondary | ICD-10-CM | POA: Diagnosis not present

## 2016-01-27 DIAGNOSIS — I10 Essential (primary) hypertension: Secondary | ICD-10-CM | POA: Insufficient documentation

## 2016-01-27 DIAGNOSIS — M79674 Pain in right toe(s): Secondary | ICD-10-CM | POA: Insufficient documentation

## 2016-01-27 DIAGNOSIS — Z79899 Other long term (current) drug therapy: Secondary | ICD-10-CM | POA: Diagnosis not present

## 2016-01-27 LAB — CBC WITH DIFFERENTIAL/PLATELET
BASOS ABS: 0 10*3/uL (ref 0.0–0.1)
BASOS PCT: 1 %
EOS ABS: 0.3 10*3/uL (ref 0.0–0.7)
EOS PCT: 7 %
HCT: 39.3 % (ref 39.0–52.0)
Hemoglobin: 13.3 g/dL (ref 13.0–17.0)
LYMPHS ABS: 1.9 10*3/uL (ref 0.7–4.0)
Lymphocytes Relative: 46 %
MCH: 26.8 pg (ref 26.0–34.0)
MCHC: 33.8 g/dL (ref 30.0–36.0)
MCV: 79.2 fL (ref 78.0–100.0)
Monocytes Absolute: 0.4 10*3/uL (ref 0.1–1.0)
Monocytes Relative: 11 %
NEUTROS PCT: 35 %
Neutro Abs: 1.4 10*3/uL — ABNORMAL LOW (ref 1.7–7.7)
PLATELETS: 158 10*3/uL (ref 150–400)
RBC: 4.96 MIL/uL (ref 4.22–5.81)
RDW: 13.5 % (ref 11.5–15.5)
WBC: 4 10*3/uL (ref 4.0–10.5)

## 2016-01-27 LAB — BASIC METABOLIC PANEL
Anion gap: 5 (ref 5–15)
BUN: 9 mg/dL (ref 6–20)
CHLORIDE: 108 mmol/L (ref 101–111)
CO2: 24 mmol/L (ref 22–32)
Calcium: 8.1 mg/dL — ABNORMAL LOW (ref 8.9–10.3)
Creatinine, Ser: 0.65 mg/dL (ref 0.61–1.24)
GFR calc non Af Amer: >60 mL/min (ref >60)
GLUCOSE: 88 mg/dL (ref 65–99)
POTASSIUM: 4.1 mmol/L (ref 3.5–5.1)
SODIUM: 137 mmol/L (ref 135–145)

## 2016-01-27 LAB — I-STAT CG4 LACTIC ACID, ED: Lactic Acid, Venous: 0.53 mmol/L (ref 0.5–1.9)

## 2016-01-27 MED ORDER — DOXYCYCLINE HYCLATE 100 MG PO CAPS: 100.0000 mg | ORAL_CAPSULE | Freq: Two times a day (BID) | ORAL | 0 refills | Status: AC

## 2016-01-27 MED ORDER — HYDROCODONE-ACETAMINOPHEN 5-325 MG PO TABS
2.0000 | ORAL_TABLET | Freq: Once | ORAL | Status: AC
  Administered 2016-01-27: 2 via ORAL
  Filled 2016-01-27: qty 2

## 2016-01-27 NOTE — Discharge Instructions (Signed)
Keep your toe clean and dry and apply bacitracin and a clean bandage on it twice daily. Take the antibiotic as prescribed and be sure to complete the entire course. Follow up with your primary care provider or your poditrist in 2 days to have your toe reevaluated.   Return to the emergency department if you experience worsening pain, swelling, redness, discharge, fever or any other concerning symptoms.

## 2016-01-27 NOTE — ED Triage Notes (Signed)
Patient c/o left great toe pain that radiates into left foot. Per patient seen two weeks ago and had toenail removed from left great toe due to foot fungus. Per patient pain progressively getting worse. Patient reports taking tylenol with no relief. Patient denies hx of diabetes.
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
## 2016-01-30 ENCOUNTER — Ambulatory Visit: Payer: Medicaid Other | Admitting: Family Medicine

## 2016-01-31 ENCOUNTER — Ambulatory Visit: Payer: Medicaid Other | Admitting: Family Medicine

## 2016-07-10 ENCOUNTER — Telehealth: Payer: Self-pay | Admitting: Internal Medicine

## 2016-07-10 NOTE — Telephone Encounter (Signed)
[facility] called and stated they are still outstanding orders for this patient from last year.   Please call for clarification.

## 2016-07-11 NOTE — Telephone Encounter (Signed)
[facility] is [facility]. Rep Dorene SorrowJerry stated the order was outstanding and she will contact [facility] to have order scheduled for patient. No further questions at this time.
